# Patient Record
Sex: Male | Born: 1960
Health system: Southern US, Community
[De-identification: ages and names within clinical notes are randomized; demographics above are authoritative.]

## PROBLEM LIST (undated history)

## (undated) ENCOUNTER — Ambulatory Visit: Admission: EM

## (undated) DIAGNOSIS — G629 Polyneuropathy, unspecified: Secondary | ICD-10-CM

## (undated) DIAGNOSIS — G709 Myoneural disorder, unspecified: Secondary | ICD-10-CM

## (undated) DIAGNOSIS — Z9889 Other specified postprocedural states: Secondary | ICD-10-CM

## (undated) DIAGNOSIS — L910 Hypertrophic scar: Secondary | ICD-10-CM

## (undated) DIAGNOSIS — E785 Hyperlipidemia, unspecified: Secondary | ICD-10-CM

## (undated) DIAGNOSIS — N529 Male erectile dysfunction, unspecified: Secondary | ICD-10-CM

## (undated) HISTORY — DX: Polyneuropathy, unspecified: G62.9

## (undated) HISTORY — DX: Hypertrophic scar: L91.0

## (undated) HISTORY — PX: POLYPECTOMY: SHX149

## (undated) HISTORY — DX: Male erectile dysfunction, unspecified: N52.9

## (undated) HISTORY — DX: Myoneural disorder, unspecified: G70.9

## (undated) HISTORY — DX: Other specified postprocedural states: Z98.890

## (undated) HISTORY — PX: EPIDURAL BLOCK INJECTION: SHX1516

## (undated) HISTORY — DX: Hyperlipidemia, unspecified: E78.5

---

## 2006-02-19 ENCOUNTER — Ambulatory Visit: Payer: Self-pay | Admitting: Family Medicine

## 2006-02-26 ENCOUNTER — Ambulatory Visit: Payer: Self-pay | Admitting: Family Medicine

## 2006-04-30 ENCOUNTER — Ambulatory Visit: Payer: Self-pay | Admitting: Family Medicine

## 2006-05-15 ENCOUNTER — Ambulatory Visit: Payer: Self-pay | Admitting: Family Medicine

## 2006-09-18 DIAGNOSIS — N529 Male erectile dysfunction, unspecified: Secondary | ICD-10-CM

## 2006-09-18 HISTORY — PX: LASIK: SHX215

## 2006-09-18 HISTORY — DX: Male erectile dysfunction, unspecified: N52.9

## 2007-03-21 ENCOUNTER — Ambulatory Visit: Payer: Self-pay | Admitting: Family Medicine

## 2008-02-19 ENCOUNTER — Ambulatory Visit: Payer: Self-pay | Admitting: Family Medicine

## 2008-07-16 ENCOUNTER — Ambulatory Visit: Payer: Self-pay | Admitting: Family Medicine

## 2010-04-19 ENCOUNTER — Ambulatory Visit: Payer: Self-pay | Admitting: Physician Assistant

## 2010-04-19 ENCOUNTER — Encounter: Payer: Self-pay | Admitting: Family Medicine

## 2010-04-19 ENCOUNTER — Ambulatory Visit: Payer: Self-pay | Admitting: Vascular Surgery

## 2010-04-19 ENCOUNTER — Ambulatory Visit (HOSPITAL_COMMUNITY): Admission: RE | Admit: 2010-04-19 | Discharge: 2010-04-19 | Payer: Self-pay | Admitting: Family Medicine

## 2010-05-10 ENCOUNTER — Ambulatory Visit: Payer: Self-pay | Admitting: Family Medicine

## 2011-07-24 ENCOUNTER — Telehealth: Payer: Self-pay | Admitting: Family Medicine

## 2011-07-24 NOTE — Telephone Encounter (Signed)
THIS IS THE WRONG PT

## 2011-08-02 ENCOUNTER — Encounter: Payer: Self-pay | Admitting: Family Medicine

## 2011-08-03 ENCOUNTER — Ambulatory Visit (INDEPENDENT_AMBULATORY_CARE_PROVIDER_SITE_OTHER): Payer: 59 | Admitting: Medical

## 2011-08-03 ENCOUNTER — Encounter: Payer: Self-pay | Admitting: Medical

## 2011-08-03 DIAGNOSIS — Z0000 Encounter for general adult medical examination without abnormal findings: Secondary | ICD-10-CM | POA: Insufficient documentation

## 2011-08-03 DIAGNOSIS — N529 Male erectile dysfunction, unspecified: Secondary | ICD-10-CM

## 2011-08-03 DIAGNOSIS — R12 Heartburn: Secondary | ICD-10-CM

## 2011-08-03 DIAGNOSIS — E785 Hyperlipidemia, unspecified: Secondary | ICD-10-CM

## 2011-08-03 LAB — POCT URINALYSIS DIPSTICK
Bilirubin, UA: NEGATIVE
Blood, UA: NEGATIVE
Ketones, UA: NEGATIVE
Leukocytes, UA: NEGATIVE
Spec Grav, UA: <=1.005
pH, UA: 7

## 2011-08-03 MED ORDER — SILDENAFIL CITRATE 100 MG PO TABS: 100.0000 mg | ORAL_TABLET | Freq: Every day | ORAL | Status: DC | PRN

## 2011-08-03 NOTE — Patient Instructions (Signed)
Preventative Care for Adults, Male    Consider daily multivitamin, and consider daily baby Asprin 81 mg for heart health.     REGULAR HEALTH EXAMS:  A routine yearly physical is a good way to check in with your primary care provider about your health and preventive screening. It is also an opportunity to share updates about your health and any concerns you have, and receive a thorough all-over exam.   Most health insurance companies pay for at least some preventative services.  Check with your health plan for specific coverages.  WHAT PREVENTATIVE SERVICES DO MEN NEED?  Adult men should have their weight and blood pressure checked regularly.   Men age 59 and older should have their cholesterol levels checked regularly.  Beginning at age 74 and continuing to age 58, men should be screened for colorectal cancer.  Certain people should may need continued testing until age 74.  Other cancer screening may include exams for testicular and prostate cancer.  Updating vaccinations is part of preventative care.  Vaccinations help protect against diseases such as the flu.  Lab tests are generally done as part of preventative care to screen for anemia and blood disorders, to screen for problems with the kidneys and liver, to screen for bladder problems, to check blood sugar, and to check your cholesterol level.  Preventative services generally include counseling about diet, exercise, avoiding tobacco, drugs, excessive alcohol consumption, and sexually transmitted infections.    GENERAL RECOMMENDATIONS FOR GOOD HEALTH:  Healthy diet:  Eat a variety of foods, including fruit, vegetables, animal or vegetable protein, such as meat, fish, chicken, and eggs, or beans, lentils, tofu, and grains, such as rice.  Drink plenty of water daily.  Decrease saturated fat in the diet, avoid lots of red meat, processed foods, sweets, fast foods, and fried foods.  Exercise:  Aerobic exercise helps maintain  good heart health. At least 30-40 minutes of moderate-intensity exercise is recommended. For example, a brisk walk that increases your heart rate and breathing. This should be done on most days of the week.   Find a type of exercise or a variety of exercises that you enjoy so that it becomes a part of your daily life.  Examples are running, walking, swimming, water aerobics, and biking.  For motivation and support, explore group exercise such as aerobic class, spin class, Zumba, Yoga,or  martial arts, etc.    Set exercise goals for yourself, such as a certain weight goal, walk or run in a race such as a 5k walk/run.  Speak to your primary care provider about exercise goals.  Disease prevention:  If you smoke or chew tobacco, find out from your caregiver how to quit. It can literally save your life, no matter how long you have been a tobacco user. If you do not use tobacco, never begin.   Maintain a healthy diet and normal weight. Increased weight leads to problems with blood pressure and diabetes.   The Body Mass Index or BMI is a way of measuring how much of your body is fat. Having a BMI above 27 increases the risk of heart disease, diabetes, hypertension, stroke and other problems related to obesity. Your caregiver can help determine your BMI and based on it develop an exercise and dietary program to help you achieve or maintain this important measurement at a healthful level.  High blood pressure causes heart and blood vessel problems.  Persistent high blood pressure should be treated with medicine if weight loss and  exercise do not work.   Fat and cholesterol leaves deposits in your arteries that can block them. This causes heart disease and vessel disease elsewhere in your body.  If your cholesterol is found to be high, or if you have heart disease or certain other medical conditions, then you may need to have your cholesterol monitored frequently and be treated with medication.   Ask if you  should have a stress test if your history suggests this. A stress test is a test done on a treadmill that looks for heart disease. This test can find disease prior to there being a problem.  Avoid drinking alcohol in excess (more than two drinks per day).  Avoid use of street drugs. Do not share needles with anyone. Ask for professional help if you need assistance or instructions on stopping the use of alcohol, cigarettes, and/or drugs.  Brush your teeth twice a day with fluoride toothpaste, and floss once a day. Good oral hygiene prevents tooth decay and gum disease. The problems can be painful, unattractive, and can cause other health problems. Visit your dentist for a routine oral and dental check up and preventive care every 6-12 months.   Look at your skin regularly.  Use a mirror to look at your back. Notify your caregivers of changes in moles, especially if there are changes in shapes, colors, a size larger than a pencil eraser, an irregular border, or development of new moles.  Safety:  Use seatbelts 100% of the time, whether driving or as a passenger.  Use safety devices such as hearing protection if you work in environments with loud noise or significant background noise.  Use safety glasses when doing any work that could send debris in to the eyes.  Use a helmet if you ride a bike or motorcycle.  Use appropriate safety gear for contact sports.  Talk to your caregiver about gun safety.  Use sunscreen with a SPF (or skin protection factor) of 15 or greater.  Lighter skinned people are at a greater risk of skin cancer. Don't forget to also wear sunglasses in order to protect your eyes from too much damaging sunlight. Damaging sunlight can accelerate cataract formation.   Practice safe sex. Use condoms. Condoms are used for birth control and to help reduce the spread of sexually transmitted infections (or STIs).  Some of the STIs are gonorrhea (the clap), chlamydia, syphilis, trichomonas,  herpes, HPV (human papilloma virus) and HIV (human immunodeficiency virus) which causes AIDS. The herpes, HIV and HPV are viral illnesses that have no cure. These can result in disability, cancer and death.   Keep carbon monoxide and smoke detectors in your home functioning at all times. Change the batteries every 6 months or use a model that plugs into the wall.   Vaccinations:  Stay up to date with your tetanus shots and other required immunizations. You should have a booster for tetanus every 10 years. Be sure to get your flu shot every year, since 5%-20% of the [place] population comes down with the flu. The flu vaccine changes each year, so being vaccinated once is not enough. Get your shot in the fall, before the flu season peaks.   Other vaccines to consider:  Pneumococcal vaccine to protect against certain types of pneumonia.  This is normally recommended for adults age 80 or older.  However, adults younger than 50 years old with certain underlying conditions such as diabetes, heart or lung disease should also receive the vaccine.  Shingles vaccine  to protect against Varicella Zoster if you are older than age 50, or younger than 50 years old with certain underlying illness.  Hepatitis A vaccine to protect against a form of infection of the liver by a virus acquired from food.  Hepatitis B vaccine to protect against a form of infection of the liver by a virus acquired from blood or body fluids, particularly if you work in health care.  If you plan to travel internationally, check with your local health department for specific vaccination recommendations.  Cancer Screening:  Most routine colon cancer screening begins at the age of 82. On a yearly basis, doctors may provide special easy to use take-home tests to check for hidden blood in the stool. Sigmoidoscopy or colonoscopy can detect the earliest forms of colon cancer and is life saving. These tests use a small camera at the end of a tube  to directly examine the colon. Speak to your caregiver about this at age 46, when routine screening begins (and is repeated every 5 years unless early forms of pre-cancerous polyps or small growths are found).   At the age of 97 men usually start screening for prostate cancer every year. Screening may begin at a younger age for those with higher risk. Those at higher risk include African-Americans or having a family history of prostate cancer. There are two types of tests for prostate cancer:   Prostate-specific antigen (PSA) testing. Recent studies raise questions about prostate cancer using PSA and you should discuss this with your caregiver.   Digital rectal exam (in which your doctor's lubricated and gloved finger feels for enlargement of the prostate through the anus).   Screening for testicular cancer.  Do a monthly exam of your testicles. Gently roll each testicle between your thumb and fingers, feeling for any abnormal lumps. The best time to do this is after a hot shower or bath when the tissues are looser. Notify your caregivers of any lumps, tenderness or changes in size or shape immediately.

## 2011-08-03 NOTE — Progress Notes (Signed)
Subjective:   HPI  Juan Fleming is a 50 y.o. male who presents for a complete physical.  Last visit here August of last year.  He has been in good health, exercises routinely on the treadmill with weights, eats healthy.  Last tetanus shot is within the last 10 years, but he declines flu shot today. He has never had a colonoscopy.  He has a history of rectal dysfunction, does well on Viagra. He has a history of high cholesterol, but hasn't been on medication for a while.  He has a history of keloids, and has seen Dr. Terri Fleming in the past for steroid injections of the keloids.   He is up-to-date on eye doctor and dentist visits.  Reviewed their medical, surgical, family, social, medication, and allergy history and updated chart as appropriate.  Past Medical History  Diagnosis Date  . ED (erectile dysfunction)   . Dyslipidemia   . Wears glasses   . Keloid of skin     Past Surgical History  Procedure Date  . Lasik     Family History  Problem Relation Age of Onset  . Diabetes Mother   . Hypertension Father   . Thyroid disease Sister   . Stroke Neg Hx   . Heart disease Neg Hx   . Hyperlipidemia Neg Hx   . Cancer Neg Hx     History   Social History  . Marital Status: Married    Spouse Name: N/A    Number of Children: N/A  . Years of Education: N/A   Occupational History  . maintenance [employer]   Social History Main Topics  . Smoking status: Never Smoker   . Smokeless tobacco: Not on file  . Alcohol Use: 0.5 oz/week    1 drink(s) per week  . Drug Use: No  . Sexually Active: Not on file   Other Topics Concern  . Not on file   Social History Narrative   Married, 2 children age 60 yo and 80 yo.  Exercise - treadmill, weights    No current outpatient prescriptions on file prior to visit.    No Known Allergies   Review of Systems Constitutional: -fever, -chills, -sweats, -unexpected weight change, -anorexia, -fatigue Allergy: -sneezing, -itching,  -congestion Dermatology: denies changing moles, rash, lumps, new worrisome lesions ENT: -runny nose, +ear itching, -sore throat, -hoarseness, +sinus pain, -teeth pain, -tinnitus, -hearing loss, -epistaxis Cardiology:  -chest pain, -palpitations, -edema, -orthopnea, -paroxysmal nocturnal dyspnea Respiratory: -cough, -shortness of breath, -dyspnea on exertion, -wheezing, -hemoptysis Gastroenterology: -abdominal pain, -nausea, -vomiting, -diarrhea, -constipation, -blood in stool, -changes in bowel movement, -dysphagia Hematology: -bleeding or bruising problems Musculoskeletal: -arthralgias, -myalgias, -joint swelling, -back pain, -neck pain, -cramping, -gait changes Ophthalmology: -vision changes, -eye redness, -itching, -discharge Urology: -dysuria, -difficulty urinating, -hematuria, -urinary frequency, -urgency, incontinence Neurology: -headache, -weakness, -tingling, -numbness, -speech abnormality, -memory loss, -falls, -dizziness Psychology:  -depressed mood, -agitation, -sleep problems    Objective:   Physical Exam  Filed Vitals:   08/03/11 1013  BP: 120/80  Pulse: 80  Temp: 98.1 F (36.7 C)  Resp: 16    General appearance: alert, no distress, WD/WN, black male, slightly overweight Skin:  Multiple keloid scars, linear horizontal keloid on upper chest centrally, keloid on right shoulder, linear vertical keloid along left lower back, brown round flat 3 mm lesion on right forearm midshaft, otherwise skin unremarkable HEENT: normocephalic, conjunctiva/corneas normal, sclerae anicteric, PERRLA, EOMi, nares patent, no discharge or erythema, pharynx normal Oral cavity: MMM, tongue normal, teeth in  good repair Neck: supple, no lymphadenopathy, no thyromegaly, no masses, normal ROM, no bruits Chest: non tender, normal shape and expansion Heart: RRR, normal S1, S2, no murmurs Lungs: CTA bilaterally, no wheezes, rhonchi, or rales Abdomen: +bs, soft, non tender, non distended, no masses, no  hepatomegaly, no splenomegaly, no bruits Back: non tender, normal ROM, no scoliosis Musculoskeletal: upper extremities non tender, no obvious deformity, normal ROM throughout, lower extremities non tender, no obvious deformity, normal ROM throughout Extremities: no edema, no cyanosis, no clubbing Pulses: 2+ symmetric, upper and lower extremities, normal cap refill Neurological: alert, oriented x 3, CN2-12 intact, strength normal upper extremities and lower extremities, sensation normal throughout, DTRs 2+ throughout, no cerebellar signs, gait normal Psychiatric: normal affect, behavior normal, pleasant  GU: normal male external genitalia, nontender, no masses, no hernia, no lymphadenopathy Rectal: Anus normal, prostate within normal limits, occult negative stool   Assessment and Plan :    Encounter Diagnoses  Name Primary?  . General medical examination Yes  . Heartburn   . Erectile dysfunction   . Hyperlipidemia     Physical exam - discussed healthy lifestyle, diet, exercise, preventative care, vaccinations, and addressed their concerns.    Heartburn - advised avoidance of food triggers, consider OTC Zantac or Tums.  Return if symptom worsen.  ED - refilled Viagra, doing well on this.  Hyperlipidemia - discussed his risk factors for heart disease which are male and age close to 76.  I recommend we keep his LDL less than 130.  Labs today.    We will refer him for his first screening colonoscopy.  Follow up pending labs.

## 2011-10-11 ENCOUNTER — Telehealth: Payer: Self-pay | Admitting: Medical

## 2011-10-12 NOTE — Telephone Encounter (Signed)
Juan Fleming WE NEVER GOT IN TOUCH WITH THE PATIENT TO COME BACK IN FOR BLOOD WORK. I LEFT HIM SEVERAL MESSAGES BUT HE NEVER CALLED BACK. WHEN HE CALLED IN YESTERDAY UP FRONT TO GET A REFILL ON HIS MEDICATION. I CALLED HIM RIGHT BACK AND HE STILL DID NOT ANSWER AND THERE WAS NO WAY TO LEAVE A NUMBER. CLS

## 2011-10-12 NOTE — Telephone Encounter (Signed)
pls ask jo to check on labs?  i never got results apparently.  i need an answer on this ASAP

## 2011-10-13 ENCOUNTER — Other Ambulatory Visit: Payer: Self-pay | Admitting: Medical

## 2011-10-13 MED ORDER — SILDENAFIL CITRATE 100 MG PO TABS: 100.0000 mg | ORAL_TABLET | Freq: Every day | ORAL | Status: DC | PRN

## 2011-10-13 NOTE — Telephone Encounter (Signed)
SHANE, FROM WHAT CHANDRA STATED THERE WAS A MIX UP WITH THE LAB WITH HIS BLOOD THAT WAS DRAWN 11/12.  WE HAD A BAD PHONE # & SHE WAS UNABLE TO GET IN TOUCH WITH PT.  THE PHONE # IS CORRECT NOW

## 2011-10-13 NOTE — Telephone Encounter (Signed)
Lmom notifying the patient that he would need to return to our office for lab work. CLS

## 2011-10-13 NOTE — Telephone Encounter (Signed)
Juan Fleming - pls discuss this msg with Vernona Rieger in case she has other info.  I'm still confused.  If I am seeing the msg right, he never came back for fasting labs per the physical.  If this is the case, he needs to come in for fasting labs.    If there is some other issue about the labs or if he has a question, then get him on the phone for me.

## 2011-10-16 ENCOUNTER — Other Ambulatory Visit: Payer: 59

## 2011-10-26 ENCOUNTER — Other Ambulatory Visit: Payer: 59

## 2011-10-26 DIAGNOSIS — N529 Male erectile dysfunction, unspecified: Secondary | ICD-10-CM

## 2011-10-26 DIAGNOSIS — E785 Hyperlipidemia, unspecified: Secondary | ICD-10-CM

## 2011-10-26 DIAGNOSIS — Z0000 Encounter for general adult medical examination without abnormal findings: Secondary | ICD-10-CM

## 2011-10-26 LAB — CBC WITH DIFFERENTIAL/PLATELET
Basophils Relative: 0 % (ref 0–1)
Eosinophils Absolute: 0.1 10*3/uL (ref 0.0–0.7)
Eosinophils Relative: 3 % (ref 0–5)
Hemoglobin: 14.9 g/dL (ref 13.0–17.0)
Lymphs Abs: 1.7 10*3/uL (ref 0.7–4.0)
MCH: 27.2 pg (ref 26.0–34.0)
MCHC: 31.8 g/dL (ref 30.0–36.0)
MCV: 85.6 fL (ref 78.0–100.0)
Monocytes Absolute: 0.6 10*3/uL (ref 0.1–1.0)
Monocytes Relative: 13 % — ABNORMAL HIGH (ref 3–12)
Neutrophils Relative %: 45 % (ref 43–77)
RBC: 5.47 MIL/uL (ref 4.22–5.81)

## 2011-10-27 LAB — LIPID PANEL
Cholesterol: 217 mg/dL — ABNORMAL HIGH (ref 0–200)
HDL: 50 mg/dL (ref >39)
LDL Cholesterol: 146 mg/dL — ABNORMAL HIGH (ref 0–99)
Total CHOL/HDL Ratio: 4.3 Ratio
Triglycerides: 105 mg/dL (ref <150)
VLDL: 21 mg/dL (ref 0–40)

## 2011-10-27 LAB — COMPREHENSIVE METABOLIC PANEL
Alkaline Phosphatase: 63 U/L (ref 39–117)
BUN: 15 mg/dL (ref 6–23)
CO2: 25 mEq/L (ref 19–32)
Creat: 1.37 mg/dL — ABNORMAL HIGH (ref 0.50–1.35)
Glucose, Bld: 90 mg/dL (ref 70–99)
Sodium: 142 mEq/L (ref 135–145)
Total Bilirubin: 0.6 mg/dL (ref 0.3–1.2)
Total Protein: 6.9 g/dL (ref 6.0–8.3)

## 2012-04-10 ENCOUNTER — Emergency Department (HOSPITAL_COMMUNITY)
Admission: EM | Admit: 2012-04-10 | Discharge: 2012-04-10 | Disposition: A | Discharge status: Home / Self Care | Payer: 59 | Source: Home / Self Care

## 2012-04-10 ENCOUNTER — Encounter (HOSPITAL_COMMUNITY): Payer: Self-pay

## 2012-04-10 DIAGNOSIS — M79609 Pain in unspecified limb: Secondary | ICD-10-CM

## 2012-04-10 DIAGNOSIS — M79603 Pain in arm, unspecified: Secondary | ICD-10-CM

## 2012-04-10 DIAGNOSIS — T148XXA Other injury of unspecified body region, initial encounter: Secondary | ICD-10-CM

## 2012-04-10 MED ORDER — IBUPROFEN 800 MG PO TABS
800.0000 mg | ORAL_TABLET | Freq: Once | ORAL | Status: AC
  Administered 2012-04-10: 800 mg via ORAL

## 2012-04-10 MED ORDER — CYCLOBENZAPRINE HCL 10 MG PO TABS: 10.0000 mg | ORAL_TABLET | Freq: Two times a day (BID) | ORAL | Status: AC | PRN

## 2012-04-10 MED ORDER — IBUPROFEN 800 MG PO TABS
ORAL_TABLET | ORAL | Status: AC
  Filled 2012-04-10: qty 1

## 2012-04-10 MED ORDER — NAPROXEN 500 MG PO TABS: 500.0000 mg | ORAL_TABLET | Freq: Two times a day (BID) | ORAL | Status: AC

## 2012-04-10 MED ORDER — OMEPRAZOLE 20 MG PO CPDR: 20.0000 mg | DELAYED_RELEASE_CAPSULE | Freq: Every day | ORAL | Status: DC

## 2012-04-10 NOTE — ED Notes (Signed)
C/o pain to rt upper arm for 2 weeks.  Denies injury or fall.  States he fractured this arm as a child.

## 2012-04-10 NOTE — ED Provider Notes (Signed)
History     CSN: [id number]  Arrival date & time 04/10/12  1114   None     Chief Complaint  Patient presents with  . Arm Pain    (Consider location/radiation/quality/duration/timing/severity/associated sxs/prior treatment) Patient is a 51 y.o. male presenting with arm pain. The history is provided by the patient.  Arm Pain This is a new problem. The current episode started more than 1 week ago. The problem occurs daily. The problem has not changed since onset.Pertinent negatives include no chest pain, no abdominal pain, no headaches and no shortness of breath. The symptoms are aggravated by twisting (palpation). Nothing relieves the symptoms. Treatments tried: ibuprofen x1. The treatment provided mild relief.  history of humerus fracture many years ago, concerned this may be a break, no known injury, admits to working out (aerobics) over the past six months.  Past Medical History  Diagnosis Date  . ED (erectile dysfunction)   . Dyslipidemia   . Wears glasses   . Keloid of skin     Past Surgical History  Procedure Date  . Lasik     Family History  Problem Relation Age of Onset  . Diabetes Mother   . Hypertension Father   . Thyroid disease Sister   . Stroke Neg Hx   . Heart disease Neg Hx   . Hyperlipidemia Neg Hx   . Cancer Neg Hx     History  Substance Use Topics  . Smoking status: Never Smoker   . Smokeless tobacco: Not on file  . Alcohol Use: No      Review of Systems  Respiratory: Negative for shortness of breath.   Cardiovascular: Negative for chest pain.  Gastrointestinal: Negative for abdominal pain.  Neurological: Negative for headaches.  All other systems reviewed and are negative.    Allergies  Review of patient's allergies indicates no known allergies.  Home Medications   Current Outpatient Rx  Name Route Sig Dispense Refill  . CYCLOBENZAPRINE HCL 10 MG PO TABS Oral Take 1 tablet (10 mg total) by mouth 2 (two) times daily as needed for  muscle spasms. 20 tablet 0  . NAPROXEN 500 MG PO TABS Oral Take 1 tablet (500 mg total) by mouth 2 (two) times daily. 30 tablet 0  . SILDENAFIL CITRATE 100 MG PO TABS Oral Take 1 tablet (100 mg total) by mouth daily as needed. 1/2 tablet daily prn 10 tablet 3    BP 120/88  Pulse 81  Temp 98.3 F (36.8 C) (Oral)  Resp 16  SpO2 100%  Physical Exam  Nursing note and vitals reviewed. Constitutional: He is oriented to person, place, and time. Vital signs are normal. He appears well-developed and well-nourished. He is active and cooperative.  HENT:  Head: Normocephalic.  Eyes: Conjunctivae are normal. Pupils are equal, round, and reactive to light. No scleral icterus.  Neck: Trachea normal, normal range of motion and full passive range of motion without pain. Neck supple. Muscular tenderness present. No spinous process tenderness present. Normal range of motion present.       Right paraspinal tenderness below C7 into right trapezius muscle  Cardiovascular: Normal rate and regular rhythm.   Pulmonary/Chest: Effort normal and breath sounds normal. He exhibits no tenderness.  Musculoskeletal:       Right shoulder: Normal.       Right elbow: Normal.      Right wrist: Normal.       Cervical back: He exhibits tenderness.       Thoracic  back: Normal.       Right upper arm: He exhibits tenderness. He exhibits no bony tenderness and no edema.       Right forearm: Normal.       Right hand: He exhibits normal range of motion, no tenderness, normal two-point discrimination and normal capillary refill. normal sensation noted. Normal strength noted.       Mid arm tenderness with palpation.  Neurological: He is alert and oriented to person, place, and time. He has normal strength. No cranial nerve deficit or sensory deficit. GCS eye subscore is 4. GCS verbal subscore is 5. GCS motor subscore is 6.  Skin: Skin is warm and dry.  Psychiatric: He has a normal mood and affect. His speech is normal and  behavior is normal. Judgment and thought content normal. Cognition and memory are normal.    ED Course  Procedures (including critical care time)  Labs Reviewed - No data to display No results found.   1. Muscle strain   2. Arm pain       MDM  Do not believe pain is related to fracture, no injury to suggest that, this is a musculoskeletal injury, take medications as prescribed for pain, apply intermittent application of cold packs (later, may switch to heat, but do not sleep on heating pad).  Call or return to clinic prn if these symptoms worsen or fail to improve as anticipated. Imaging not indicated at this time.        Johnsie Kindred, NP 04/10/12 1218  Johnsie Kindred, NP 04/10/12 1218

## 2012-04-11 NOTE — ED Provider Notes (Signed)
Medical screening examination/treatment/procedure(s) were performed by non-physician practitioner and as supervising physician I was immediately available for consultation/collaboration.   [facility]; MD   Sharin Grave, MD 04/11/12 [phone]

## 2012-07-23 ENCOUNTER — Encounter: Payer: Self-pay | Admitting: Family Medicine

## 2012-07-23 ENCOUNTER — Ambulatory Visit (INDEPENDENT_AMBULATORY_CARE_PROVIDER_SITE_OTHER): Payer: 59 | Admitting: Family Medicine

## 2012-07-23 VITALS — BP 120/80 | HR 90 | Wt 199.0 lb

## 2012-07-23 DIAGNOSIS — M542 Cervicalgia: Secondary | ICD-10-CM

## 2012-07-23 NOTE — Progress Notes (Signed)
  Subjective:    Patient ID: Juan Fleming, male    DOB: Aug 31, 1961, 51 y.o.   MRN: [id number]  HPI He complains of a two-month history of right-sided neck pain with deviation into the shoulder when he flexes his neck to the right. He's had no numbness, tingling or weakness. The pain has not changed in the last several months. No history of injury to that area.   Review of Systems     Objective:   Physical Exam Alert and in no distress. Good range of motion of his neck with pain with right lateral flexion and radiation to the shoulder. Normal motor, sensory DTRs. No tenderness palpation of the neck.       Assessment & Plan:   1. Neck pain on right side  MR Angiogram Neck W Wo Contrast, MR Cervical Spine Wo Contrast   MR angiogram was removed

## 2012-07-26 ENCOUNTER — Ambulatory Visit
Admission: RE | Admit: 2012-07-26 | Discharge: 2012-07-26 | Disposition: A | Discharge status: Home / Self Care | Payer: 59 | Source: Ambulatory Visit | Attending: Family Medicine | Admitting: Family Medicine

## 2012-07-26 ENCOUNTER — Other Ambulatory Visit: Payer: 59

## 2012-07-26 DIAGNOSIS — M542 Cervicalgia: Secondary | ICD-10-CM

## 2012-08-12 ENCOUNTER — Telehealth: Payer: Self-pay

## 2012-08-12 NOTE — Telephone Encounter (Signed)
Dr.Lalonde Chris from [employer] called left me a message that they have tried calling and have left 3 messages have not had a return call just FYI for you

## 2013-10-01 ENCOUNTER — Ambulatory Visit (INDEPENDENT_AMBULATORY_CARE_PROVIDER_SITE_OTHER): Payer: 59 | Admitting: Medical

## 2013-10-01 ENCOUNTER — Encounter: Payer: Self-pay | Admitting: Medical

## 2013-10-01 ENCOUNTER — Encounter: Payer: Self-pay | Admitting: Internal Medicine

## 2013-10-01 VITALS — BP 110/80 | HR 82 | Temp 97.7°F | Resp 16 | Ht 69.5 in | Wt 196.0 lb

## 2013-10-01 DIAGNOSIS — Z1211 Encounter for screening for malignant neoplasm of colon: Secondary | ICD-10-CM

## 2013-10-01 DIAGNOSIS — N529 Male erectile dysfunction, unspecified: Secondary | ICD-10-CM

## 2013-10-01 DIAGNOSIS — Z0000 Encounter for general adult medical examination without abnormal findings: Secondary | ICD-10-CM

## 2013-10-01 DIAGNOSIS — Z125 Encounter for screening for malignant neoplasm of prostate: Secondary | ICD-10-CM

## 2013-10-01 DIAGNOSIS — E785 Hyperlipidemia, unspecified: Secondary | ICD-10-CM

## 2013-10-01 DIAGNOSIS — Z23 Encounter for immunization: Secondary | ICD-10-CM

## 2013-10-01 DIAGNOSIS — J309 Allergic rhinitis, unspecified: Secondary | ICD-10-CM

## 2013-10-01 DIAGNOSIS — L84 Corns and callosities: Secondary | ICD-10-CM

## 2013-10-01 LAB — CBC
HEMATOCRIT: 47.7 % (ref 39.0–52.0)
Hemoglobin: 16.1 g/dL (ref 13.0–17.0)
MCH: 27.6 pg (ref 26.0–34.0)
MCHC: 33.8 g/dL (ref 30.0–36.0)
MCV: 81.7 fL (ref 78.0–100.0)
Platelets: 311 10*3/uL (ref 150–400)
RBC: 5.84 MIL/uL — AB (ref 4.22–5.81)
RDW: 13.9 % (ref 11.5–15.5)
WBC: 5.4 10*3/uL (ref 4.0–10.5)

## 2013-10-01 LAB — COMPREHENSIVE METABOLIC PANEL
ALT: 19 U/L (ref 0–53)
AST: 19 U/L (ref 0–37)
Albumin: 4.3 g/dL (ref 3.5–5.2)
Alkaline Phosphatase: 84 U/L (ref 39–117)
BILIRUBIN TOTAL: 0.6 mg/dL (ref 0.3–1.2)
BUN: 16 mg/dL (ref 6–23)
CALCIUM: 9.8 mg/dL (ref 8.4–10.5)
CHLORIDE: 99 meq/L (ref 96–112)
CO2: 29 meq/L (ref 19–32)
CREATININE: 1.28 mg/dL (ref 0.50–1.35)
GLUCOSE: 81 mg/dL (ref 70–99)
Potassium: 4.1 mEq/L (ref 3.5–5.3)
Sodium: 136 mEq/L (ref 135–145)
Total Protein: 7.4 g/dL (ref 6.0–8.3)

## 2013-10-01 LAB — POCT URINALYSIS DIPSTICK
BILIRUBIN UA: NEGATIVE
Blood, UA: NEGATIVE
GLUCOSE UA: NEGATIVE
KETONES UA: NEGATIVE
LEUKOCYTES UA: NEGATIVE
Nitrite, UA: NEGATIVE
PH UA: 5
Protein, UA: NEGATIVE
Spec Grav, UA: <=1.005
Urobilinogen, UA: NEGATIVE

## 2013-10-01 LAB — LIPID PANEL
CHOL/HDL RATIO: 4.3 ratio
CHOLESTEROL: 226 mg/dL — AB (ref 0–200)
HDL: 52 mg/dL (ref >39)
LDL Cholesterol: 153 mg/dL — ABNORMAL HIGH (ref 0–99)
TRIGLYCERIDES: 107 mg/dL (ref <150)
VLDL: 21 mg/dL (ref 0–40)

## 2013-10-01 MED ORDER — SILDENAFIL CITRATE 100 MG PO TABS: 100.0000 mg | ORAL_TABLET | Freq: Every day | ORAL | Status: DC | PRN

## 2013-10-01 NOTE — Addendum Note (Signed)
Addended by: Carlena Hurl on: 10/01/2013 11:47 AM   Modules accepted: Orders

## 2013-10-01 NOTE — Patient Instructions (Signed)
Encounter Diagnoses  Name Primary?  . Routine general medical examination at a health care facility Yes  . Special screening for malignant neoplasms, colon   . Screening for prostate cancer   . Hyperlipidemia   . Erectile dysfunction   . Need for Tdap vaccination   . Allergic rhinitis   . Corn of foot    We have referred you for your first screening colonoscopy  Pending labs, I may have you start cholesterol medication. Limit red meat and high cholesterol foods.  We updated your tetanus diphtheria and pertussis vaccine today, this is good for 10 years.  Begin over-the-counter of Allegra or Zyrtec at bedtime for postnasal drainage and phlegm and allergen triggers.  Use this daily at bedtime.  Corn of foot-try doing Epsom salt soaks and Pumice stone to the foot area several days per week.  You can also try over-the-counter corn remover.  If this doesn't help we can refer you to dermatology  Preventative Care for Adults, Male       REGULAR HEALTH EXAMS:  A routine yearly physical is a good way to check in with your primary care provider about your health and preventive screening. It is also an opportunity to share updates about your health and any concerns you have, and receive a thorough all-over exam.   Most health insurance companies pay for at least some preventative services.  Check with your health plan for specific coverages.  WHAT PREVENTATIVE SERVICES DO MEN NEED?  Adult men should have their weight and blood pressure checked regularly.   Men age 26 and older should have their cholesterol levels checked regularly.  Beginning at age 100 and continuing to age 43, men should be screened for colorectal cancer.  Certain people should may need continued testing until age 73.  Other cancer screening may include exams for testicular and prostate cancer.  Updating vaccinations is part of preventative care.  Vaccinations help protect against diseases such as the flu.  Lab tests  are generally done as part of preventative care to screen for anemia and blood disorders, to screen for problems with the kidneys and liver, to screen for bladder problems, to check blood sugar, and to check your cholesterol level.  Preventative services generally include counseling about diet, exercise, avoiding tobacco, drugs, excessive alcohol consumption, and sexually transmitted infections.    GENERAL RECOMMENDATIONS FOR GOOD HEALTH:  Healthy diet:  Eat a variety of foods, including fruit, vegetables, animal or vegetable protein, such as meat, fish, chicken, and eggs, or beans, lentils, tofu, and grains, such as rice.  Drink plenty of water daily.  Decrease saturated fat in the diet, avoid lots of red meat, processed foods, sweets, fast foods, and fried foods.  Exercise:  Aerobic exercise helps maintain good heart health. At least 30-40 minutes of moderate-intensity exercise is recommended. For example, a brisk walk that increases your heart rate and breathing. This should be done on most days of the week.   Find a type of exercise or a variety of exercises that you enjoy so that it becomes a part of your daily life.  Examples are running, walking, swimming, water aerobics, and biking.  For motivation and support, explore group exercise such as aerobic class, spin class, Zumba, Yoga,or  martial arts, etc.    Set exercise goals for yourself, such as a certain weight goal, walk or run in a race such as a 5k walk/run.  Speak to your primary care provider about exercise goals.  Disease prevention:  If you smoke or chew tobacco, find out from your caregiver how to quit. It can literally save your life, no matter how long you have been a tobacco user. If you do not use tobacco, never begin.   Maintain a healthy diet and normal weight. Increased weight leads to problems with blood pressure and diabetes.   The Body Mass Index or BMI is a way of measuring how much of your body is fat. Having  a BMI above 27 increases the risk of heart disease, diabetes, hypertension, stroke and other problems related to obesity. Your caregiver can help determine your BMI and based on it develop an exercise and dietary program to help you achieve or maintain this important measurement at a healthful level.  High blood pressure causes heart and blood vessel problems.  Persistent high blood pressure should be treated with medicine if weight loss and exercise do not work.   Fat and cholesterol leaves deposits in your arteries that can block them. This causes heart disease and vessel disease elsewhere in your body.  If your cholesterol is found to be high, or if you have heart disease or certain other medical conditions, then you may need to have your cholesterol monitored frequently and be treated with medication.   Ask if you should have a stress test if your history suggests this. A stress test is a test done on a treadmill that looks for heart disease. This test can find disease prior to there being a problem.  Avoid drinking alcohol in excess (more than two drinks per day).  Avoid use of street drugs. Do not share needles with anyone. Ask for professional help if you need assistance or instructions on stopping the use of alcohol, cigarettes, and/or drugs.  Brush your teeth twice a day with fluoride toothpaste, and floss once a day. Good oral hygiene prevents tooth decay and gum disease. The problems can be painful, unattractive, and can cause other health problems. Visit your dentist for a routine oral and dental check up and preventive care every 6-12 months.   Look at your skin regularly.  Use a mirror to look at your back. Notify your caregivers of changes in moles, especially if there are changes in shapes, colors, a size larger than a pencil eraser, an irregular border, or development of new moles.  Safety:  Use seatbelts 100% of the time, whether driving or as a passenger.  Use safety devices such  as hearing protection if you work in environments with loud noise or significant background noise.  Use safety glasses when doing any work that could send debris in to the eyes.  Use a helmet if you ride a bike or motorcycle.  Use appropriate safety gear for contact sports.  Talk to your caregiver about gun safety.  Use sunscreen with a SPF (or skin protection factor) of 15 or greater.  Lighter skinned people are at a greater risk of skin cancer. Don't forget to also wear sunglasses in order to protect your eyes from too much damaging sunlight. Damaging sunlight can accelerate cataract formation.   Practice safe sex. Use condoms. Condoms are used for birth control and to help reduce the spread of sexually transmitted infections (or STIs).  Some of the STIs are gonorrhea (the clap), chlamydia, syphilis, trichomonas, herpes, HPV (human papilloma virus) and HIV (human immunodeficiency virus) which causes AIDS. The herpes, HIV and HPV are viral illnesses that have no cure. These can result in disability, cancer and death.   Keep  carbon monoxide and smoke detectors in your home functioning at all times. Change the batteries every 6 months or use a model that plugs into the wall.   Vaccinations:  Stay up to date with your tetanus shots and other required immunizations. You should have a booster for tetanus every 10 years. Be sure to get your flu shot every year, since 5%-20% of the [place] population comes down with the flu. The flu vaccine changes each year, so being vaccinated once is not enough. Get your shot in the fall, before the flu season peaks.   Other vaccines to consider:  Pneumococcal vaccine to protect against certain types of pneumonia.  This is normally recommended for adults age 37 or older.  However, adults younger than 53 years old with certain underlying conditions such as diabetes, heart or lung disease should also receive the vaccine.  Shingles vaccine to protect against Varicella Zoster  if you are older than age 25, or younger than 53 years old with certain underlying illness.  Hepatitis A vaccine to protect against a form of infection of the liver by a virus acquired from food.  Hepatitis B vaccine to protect against a form of infection of the liver by a virus acquired from blood or body fluids, particularly if you work in health care.  If you plan to travel internationally, check with your local health department for specific vaccination recommendations.  Cancer Screening:  Most routine colon cancer screening begins at the age of 45. On a yearly basis, doctors may provide special easy to use take-home tests to check for hidden blood in the stool. Sigmoidoscopy or colonoscopy can detect the earliest forms of colon cancer and is life saving. These tests use a small camera at the end of a tube to directly examine the colon. Speak to your caregiver about this at age 65, when routine screening begins (and is repeated every 5 years unless early forms of pre-cancerous polyps or small growths are found).   At the age of 68 men usually start screening for prostate cancer every year. Screening may begin at a younger age for those with higher risk. Those at higher risk include African-Americans or having a family history of prostate cancer. There are two types of tests for prostate cancer:   Prostate-specific antigen (PSA) testing. Recent studies raise questions about prostate cancer using PSA and you should discuss this with your caregiver.   Digital rectal exam (in which your doctor's lubricated and gloved finger feels for enlargement of the prostate through the anus).   Screening for testicular cancer.  Do a monthly exam of your testicles. Gently roll each testicle between your thumb and fingers, feeling for any abnormal lumps. The best time to do this is after a hot shower or bath when the tissues are looser. Notify your caregivers of any lumps, tenderness or changes in size or shape  immediately.

## 2013-10-01 NOTE — Progress Notes (Signed)
Subjective:   HPI  Juan Fleming is a 53 y.o. male who presents for a complete physical.     Preventative care: Last ophthalmology visit:N/A Last dental visit:YES DR. HOBBS Last colonoscopy:N/A Last prostate exam: 2013 Last EKG:04/2010 Last labs:2013  Prior vaccinations: TD or Tdap:? Influenza:NO Pneumococcal:N/A Shingles/Zostavax:N/A Other: NO OTHER DOCTORS   Concerns: Hyperlipidemia - not currenlty on medication.  eatrs chicken, some lunch meat, not a lot of red meat though.    ED - does ok on viagra.     He notes some sinus drainage, post nasal drainage.  Does get some runny nose and sneezing, itching eyes.    Sees to get a lot of phlegm.    No fever, not sick feeling.     Complains of a sore tender place of his right foot, x2 weeks.  Reviewed their medical, surgical, family, social, medication, and allergy history and updated chart as appropriate.  Past Medical History  Diagnosis Date  . ED (erectile dysfunction) 2008  . Dyslipidemia   . Keloid of skin   . History of EKG 1/15  . Status post LASIK surgery     Past Surgical History  Procedure Laterality Date  . Lasik    . Colonoscopy      pending 09/2013    History   Social History  . Marital Status: Married    Spouse Name: N/A    Number of Children: N/A  . Years of Education: N/A   Occupational History  . maintenance [employer]   Social History Main Topics  . Smoking status: Never Smoker   . Smokeless tobacco: Not on file  . Alcohol Use: No     Comment: occasional on a holiday  . Drug Use: No  . Sexual Activity: Not on file   Other Topics Concern  . Not on file   Social History Narrative   Married, 2 children, Exercise -frequently, treadmill, weights.  Works at [employer], [employer]    Family History  Problem Relation Age of Onset  . Diabetes Mother   . Hypertension Father   . Thyroid disease Sister   . Stroke Neg Hx   . Heart disease Neg Hx   . Hyperlipidemia Neg Hx   . Cancer  Neg Hx     Current outpatient prescriptions:sildenafil (VIAGRA) 100 MG tablet, Take 1 tablet (100 mg total) by mouth daily as needed. 1/2 tablet daily prn, Disp: 10 tablet, Rfl: 3;  omeprazole (PRILOSEC) 20 MG capsule, Take 1 capsule (20 mg total) by mouth daily., Disp: 30 capsule, Rfl: 2  No Known Allergies     Review of Systems Constitutional: -fever, -chills, -sweats, -unexpected weight change, -decreased appetite, -fatigue Allergy: -sneezing, -itching, -congestion Dermatology: -changing moles, --rash, -lumps ENT: -runny nose, -ear pain, -sore throat, -hoarseness, -sinus pain, -teeth pain, - ringing in ears, -hearing loss, -nosebleeds Cardiology: -chest pain, -palpitations, -swelling, -difficulty breathing when lying flat, -waking up short of breath Respiratory: -cough, -shortness of breath, -difficulty breathing with exercise or exertion, -wheezing, -coughing up blood Gastroenterology: -abdominal pain, -nausea, -vomiting, -diarrhea, -constipation, -blood in stool, -changes in bowel movement, -difficulty swallowing or eating Hematology: -bleeding, -bruising  Musculoskeletal: -joint aches, -muscle aches, -joint swelling, -back pain, -neck pain, -cramping, -changes in gait, Ophthalmology: denies vision changes, eye redness, itching, discharge Urology: -burning with urination, -difficulty urinating, -blood in urine, -urinary frequency, -urgency, -incontinence Neurology: -headache, -weakness, -tingling, -numbness, -memory loss, -falls, -dizziness Psychology: -depressed mood, -agitation, -sleep problems     Objective:   Physical Exam  BP 110/80  Pulse 82  Temp(Src) 97.7 F (36.5 C) (Oral)  Resp 16  Ht 5' 9.5" (1.765 m)  Wt 196 lb (88.905 kg)  BMI 28.54 kg/m2  General appearance: alert, no distress, WD/WN, AA male Skin: scattered benign appearing lesions, no worrisome lesions HEENT: normocephalic, conjunctiva/corneas normal, sclerae anicteric, PERRLA, EOMi, nares patent, no  discharge or erythema, pharynx normal Oral cavity: MMM, tongue normal, teeth in good repair Neck: supple, no lymphadenopathy, no thyromegaly, no masses, normal ROM, no bruits Chest: non tender, normal shape and expansion Heart: RRR, normal S1, S2, no murmurs Lungs: CTA bilaterally, no wheezes, rhonchi, or rales Abdomen: +bs, soft, non tender, non distended, no masses, no hepatomegaly, no splenomegaly, no bruits Back: non tender, normal ROM, no scoliosis Musculoskeletal: right volar foot overlying 4th distal metatarsal bone with round 2mm tender density likley corn, bony tibial tuberosities suggestive of prior osgood slatters, upper extremities non tender, no obvious deformity, normal ROM throughout, lower extremities non tender, no obvious deformity, normal ROM throughout Extremities: no edema, no cyanosis, no clubbing Pulses: 2+ symmetric, upper and lower extremities, normal cap refill Neurological: alert, oriented x 3, CN2-12 intact, strength normal upper extremities and lower extremities, sensation normal throughout, DTRs 2+ throughout, no cerebellar signs, gait normal Psychiatric: normal affect, behavior normal, pleasant  GU: normal male external genitalia, circumcised, nontender, no masses, no hernia, no lymphadenopathy Rectal: anus normal tone, prostate WNL, occult negative stool   Adult ECG Report  Indication: erectile dysfunction  Rate: 77 bpm  Rhythm: normal sinus rhythm  QRS Axis: 47 degrees  PR Interval: 142ms  QRS Duration: 9ms  QTc: 331ms  Conduction Disturbances: none  Other Abnormalities: Isolated ST depression in lead 3 unchanged from prior EKG  Patient's cardiac risk factors are: dyslipidemia, ED  EKG comparison: 2006, unchanged, no new changes  Narrative Interpretation: No acute changes   Assessment and Plan :      Encounter Diagnoses  Name Primary?  . Routine general medical examination at a health care facility Yes  . Special screening for malignant  neoplasms, colon   . Screening for prostate cancer   . Hyperlipidemia   . Erectile dysfunction   . Need for Tdap vaccination     Physical exam - discussed healthy lifestyle, diet, exercise, preventative care, vaccinations, and addressed their concerns.  I advised he see eye doctor and dentist yearly. Referral for first colonoscopy screening Hyperlipidemia-pending labs, we'll likely start him on statin.  He has had plenty of prior attempts at diet and prior labs all show abnormal lipids.  Has been on lipitor and zocor in the past but with poor compliance Erectile dysfunction-c/t Viagra prn. Counseled on the Tdap (tetanus, diptheria, and acellular pertussis) vaccine.  Vaccine information sheet given. Tdap vaccine given after consent obtained. Follow-up pending labs

## 2013-10-02 ENCOUNTER — Other Ambulatory Visit: Payer: Self-pay | Admitting: Medical

## 2013-10-02 LAB — PSA: PSA: 1.36 ng/mL (ref <=4.00)

## 2013-10-02 MED ORDER — ATORVASTATIN CALCIUM 20 MG PO TABS: 20.0000 mg | ORAL_TABLET | Freq: Every day | ORAL | Status: DC

## 2013-10-03 ENCOUNTER — Encounter: Payer: Self-pay | Admitting: Family Medicine

## 2013-11-07 ENCOUNTER — Ambulatory Visit (AMBULATORY_SURGERY_CENTER): Payer: Self-pay | Admitting: *Deleted

## 2013-11-07 VITALS — Ht 69.0 in | Wt 198.8 lb

## 2013-11-07 DIAGNOSIS — Z1211 Encounter for screening for malignant neoplasm of colon: Secondary | ICD-10-CM

## 2013-11-07 MED ORDER — MOVIPREP 100 G PO SOLR: ORAL | Status: DC

## 2013-11-07 NOTE — Progress Notes (Signed)
No allergies to eggs or soy. No prior anesthesia.  

## 2013-11-13 ENCOUNTER — Encounter: Payer: Self-pay | Admitting: Internal Medicine

## 2013-11-16 HISTORY — PX: COLONOSCOPY: SHX174

## 2013-11-18 ENCOUNTER — Telehealth: Payer: Self-pay | Admitting: Internal Medicine

## 2013-11-18 NOTE — Telephone Encounter (Signed)
Called pt at home. He states he had eaten butter beans and wheat crackers on Saturday and did not know if he needs to cancel the procedure for Friday.Informed pt to try to force plenty of liquids and to avoid the foods with fiber listed on his instruction sheet. He does not need to cancel the colonoscopy on Friday.Advised  pt to call our office if he has any further questions.He understood.

## 2013-11-21 ENCOUNTER — Ambulatory Visit (AMBULATORY_SURGERY_CENTER): Payer: 59 | Admitting: Internal Medicine

## 2013-11-21 ENCOUNTER — Encounter: Payer: Self-pay | Admitting: Internal Medicine

## 2013-11-21 VITALS — BP 92/71 | HR 71 | Temp 97.2°F | Resp 14 | Ht 69.0 in | Wt 198.0 lb

## 2013-11-21 DIAGNOSIS — D126 Benign neoplasm of colon, unspecified: Secondary | ICD-10-CM

## 2013-11-21 DIAGNOSIS — Z1211 Encounter for screening for malignant neoplasm of colon: Secondary | ICD-10-CM

## 2013-11-21 MED ORDER — SODIUM CHLORIDE 0.9 % IV SOLN: 500.0000 mL | INTRAVENOUS | Status: DC

## 2013-11-21 NOTE — Progress Notes (Signed)
Stable to RR 

## 2013-11-21 NOTE — Progress Notes (Signed)
Called to room to assist during endoscopic procedure.  Patient ID and intended procedure confirmed with present staff. Received instructions for my participation in the procedure from the performing physician.  

## 2013-11-21 NOTE — Op Note (Signed)
[place]  [address] [place], [id number]   COLONOSCOPY PROCEDURE REPORT  PATIENT: Juan Fleming, Juan Fleming  MR#: [id number] BIRTHDATE: 09-Nov-1960 , 52  yrs. old GENDER: Male ENDOSCOPIST: Jerene Bears, MD REFERRED WU:[facility] [employer], M.D. PROCEDURE DATE:  11/21/2013 PROCEDURE:   Colonoscopy with snare polypectomy First Screening Colonoscopy - Avg.  risk and is 50 yrs.  old or older Yes.  Prior Negative Screening - Now for repeat screening. N/A  History of Adenoma - Now for follow-up colonoscopy & has been > or = to 3 yrs.  N/A  Polyps Removed Today? Yes. ASA CLASS:   Class II INDICATIONS:average risk screening and first colonoscopy. MEDICATIONS: MAC sedation, administered by CRNA and propofol (Diprivan) 200mg  IV  DESCRIPTION OF PROCEDURE:   After the risks benefits and alternatives of the procedure were thoroughly explained, informed consent was obtained.  A digital rectal exam revealed no rectal mass.   The LB [id number]  endoscope was introduced through the anus and advanced to the cecum, which was identified by both the appendix and ileocecal valve. No adverse events experienced. The quality of the prep was good, using MoviPrep  The instrument was then slowly withdrawn as the colon was fully examined.   COLON FINDINGS: A sessile polyp measuring 6 mm in size was found in the ascending colon.  A polypectomy was performed with a cold snare.  The resection was complete and the polyp tissue was completely retrieved.   Mild diverticulosis was noted in the sigmoid colon.  Retroflexed views revealed small internal hemorrhoids. The time to cecum=2 minutes 07 seconds.  Withdrawal time=11 minutes 13 seconds.  The scope was withdrawn and the procedure completed. COMPLICATIONS: There were no complications.  ENDOSCOPIC IMPRESSION: 1.   Sessile polyp measuring 6 mm in size was found in the ascending colon; polypectomy was performed with a cold snare 2.   Mild  diverticulosis was noted in the sigmoid colon  RECOMMENDATIONS: 1.  Await pathology results 2.  High fiber diet 3.  If the polyp removed today is proven to be an adenomatous (pre-cancerous) polyp, you will need a repeat colonoscopy in 5 years.  Otherwise you should continue to follow colorectal cancer screening guidelines for "routine risk" patients with colonoscopy in 10 years.  You will receive a letter within 1-2 weeks with the results of your biopsy as well as final recommendations.  Please call my office if you have not received a letter after 3 weeks.   eSigned:  Jerene Bears, MD 11/21/2013 8:51 AM  cc: The Patient and Jill Alexanders, MD

## 2013-11-21 NOTE — Patient Instructions (Signed)
YOU HAD AN ENDOSCOPIC PROCEDURE TODAY AT THE [facility]: Refer to the procedure report that was given to you for any specific questions about what was found during the examination.  If the procedure report does not answer your questions, please call your gastroenterologist to clarify.  If you requested that your care partner not be given the details of your procedure findings, then the procedure report has been included in a sealed envelope for you to review at your convenience later.  YOU SHOULD EXPECT: Some feelings of bloating in the abdomen. Passage of more gas than usual.  Walking can help get rid of the air that was put into your GI tract during the procedure and reduce the bloating. If you had a lower endoscopy (such as a colonoscopy or flexible sigmoidoscopy) you may notice spotting of blood in your stool or on the toilet paper. If you underwent a bowel prep for your procedure, then you may not have a normal bowel movement for a few days.  DIET: Your first meal following the procedure should be a light meal and then it is ok to progress to your normal diet.  A half-sandwich or bowl of soup is an example of a good first meal.  Heavy or fried foods are harder to digest and may make you feel nauseous or bloated.  Likewise meals heavy in dairy and vegetables can cause extra gas to form and this can also increase the bloating.  Drink plenty of fluids but you should avoid alcoholic beverages for 24 hours.  ACTIVITY: Your care partner should take you home directly after the procedure.  You should plan to take it easy, moving slowly for the rest of the day.  You can resume normal activity the day after the procedure however you should NOT DRIVE or use heavy machinery for 24 hours (because of the sedation medicines used during the test).    SYMPTOMS TO REPORT IMMEDIATELY: A gastroenterologist can be reached at any hour.  During normal business hours, 8:30 AM to 5:00 PM Monday through Friday,  call [phone].  After hours and on weekends, please call the GI answering service at [phone] who will take a message and have the physician on call contact you.   Following lower endoscopy (colonoscopy or flexible sigmoidoscopy):  Excessive amounts of blood in the stool  Significant tenderness or worsening of abdominal pains  Swelling of the abdomen that is new, acute  Fever of 100F or higher    FOLLOW UP: If any biopsies were taken you will be contacted by phone or by letter within the next 1-3 weeks.  Call your gastroenterologist if you have not heard about the biopsies in 3 weeks.  Our staff will call the home number listed on your records the next business day following your procedure to check on you and address any questions or concerns that you may have at that time regarding the information given to you following your procedure. This is a courtesy call and so if there is no answer at the home number and we have not heard from you through the emergency physician on call, we will assume that you have returned to your regular daily activities without incident.  SIGNATURES/CONFIDENTIALITY: You and/or your care partner have signed paperwork which will be entered into your electronic medical record.  These signatures attest to the fact that that the information above on your After Visit Summary has been reviewed and is understood.  Full responsibility of the confidentiality   of this discharge information lies with you and/or your care-partner.  Polyp, diverticulosis, and high fiber diet information given.  Dr. Hilarie Fredrickson will advise you about recall for next colonscopy after pathology is reviewed.

## 2013-11-24 ENCOUNTER — Telehealth: Payer: Self-pay

## 2013-11-24 NOTE — Telephone Encounter (Signed)
Left message on answering machine. 

## 2013-11-25 ENCOUNTER — Encounter: Payer: Self-pay | Admitting: Internal Medicine

## 2014-02-03 ENCOUNTER — Ambulatory Visit (INDEPENDENT_AMBULATORY_CARE_PROVIDER_SITE_OTHER): Payer: 59 | Admitting: Medical

## 2014-02-03 ENCOUNTER — Encounter: Payer: Self-pay | Admitting: Medical

## 2014-02-03 VITALS — BP 110/80 | HR 76 | Temp 97.9°F | Resp 14 | Wt 197.0 lb

## 2014-02-03 DIAGNOSIS — H1045 Other chronic allergic conjunctivitis: Secondary | ICD-10-CM

## 2014-02-03 DIAGNOSIS — J309 Allergic rhinitis, unspecified: Secondary | ICD-10-CM

## 2014-02-03 DIAGNOSIS — H1010 Acute atopic conjunctivitis, unspecified eye: Secondary | ICD-10-CM

## 2014-02-03 MED ORDER — FEXOFENADINE HCL 180 MG PO TABS: 180.0000 mg | ORAL_TABLET | Freq: Every day | ORAL | Status: DC

## 2014-02-03 MED ORDER — AZELASTINE HCL 0.05 % OP SOLN: 1.0000 [drp] | Freq: Two times a day (BID) | OPHTHALMIC | Status: DC

## 2014-02-03 NOTE — Progress Notes (Signed)
Subjective: Here for possible allergic reaction.  He notes 2-3 weeks of itchy water eyes, thought he had pink eye, but no goupy eyes or crusting.  Used Vysine.  Still has sneezing.  No runny nose or cough.  +post nasal drainage.  Skin at times feels itchy.  Has some blisters from time to time on arms x 1 month ago, itchy too.  Thought this was related to Lipitor and Vitamins at the time.  No fever, no NVD, no new chemicals.  No SOB, no chest pain, no lip swelling.  Works at [employer], around tobacco dust.  No other aggravating or relieving factors.  No other c/o.   Past Medical History  Diagnosis Date  . ED (erectile dysfunction) 2008  . Dyslipidemia   . Keloid of skin   . History of EKG 1/15  . Status post LASIK surgery   . Hyperlipidemia    ROS as in subjective   Objective: Filed Vitals:   02/03/14 0838  BP: 110/80  Pulse: 76  Temp: 97.9 F (36.6 C)  Resp: 14    General appearance: alert, no distress, WD/WN Skin: few healing lesions on bilat forearms suggestive of recent linear poison ivy dermatitis HEENT: normocephalic, sclerae anicteric, mild bilat conjunctival injection,TMs pearly, nares patent, no discharge or erythema, pharynx normal Oral cavity: MMM, no lesions Neck: supple, no lymphadenopathy, no thyromegaly, no masses Lungs: CTA bilaterally, no wheezes, rhonchi, or rales Pulses: 2+ symmetric, upper and lower extremities, normal cap refill Ext: edema  Assessment: Encounter Diagnoses  Name Primary?  . Allergic rhinitis Yes  . Allergic conjunctivitis    Plan: Begin Allegra, allergy eye drop, bath every evening to wash off pollen, consider nasal saline, and recheck if not improving.  Restart Lipitor as this is not the cause of the symptoms.

## 2014-02-03 NOTE — Patient Instructions (Signed)
  Thank you for giving me the opportunity to serve you today.    Your diagnosis today includes: Encounter Diagnoses  Name Primary?  . Allergic rhinitis Yes  . Allergic conjunctivitis      Specific recommendations today include:  Begin allergy eye drop  Begin allergy tablet at bedtime  Use either the 2 prescriptions I sent, or have pharmacist help you get Allegra tablets and OTC allergy eye drop  Take a shower every evening to wash off pollen  Consider nasal saline at bedtime

## 2014-10-08 ENCOUNTER — Telehealth: Payer: Self-pay | Admitting: Medical

## 2014-10-08 NOTE — Telephone Encounter (Signed)
See Kathy's note, pt has appt 10/15/14

## 2014-10-08 NOTE — Telephone Encounter (Signed)
Pt called and needs refills on viagra sent to [facility]. Pt has an appt on the 10/15/2014.

## 2014-10-08 NOTE — Telephone Encounter (Signed)
Due at this time for routine physical, last physical 1/ 2016. So schedule this please and we can refill/discuss then

## 2014-10-09 ENCOUNTER — Other Ambulatory Visit: Payer: Self-pay | Admitting: Medical

## 2014-10-09 MED ORDER — SILDENAFIL CITRATE 100 MG PO TABS: 100.0000 mg | ORAL_TABLET | Freq: Every day | ORAL | Status: DC | PRN

## 2014-10-12 NOTE — Telephone Encounter (Signed)
Juan Fleming, see message below

## 2014-10-15 ENCOUNTER — Encounter: Payer: Self-pay | Admitting: Medical

## 2014-10-27 ENCOUNTER — Encounter: Payer: Self-pay | Admitting: Medical

## 2016-02-22 ENCOUNTER — Encounter: Payer: Self-pay | Admitting: Medical

## 2016-02-22 ENCOUNTER — Ambulatory Visit (INDEPENDENT_AMBULATORY_CARE_PROVIDER_SITE_OTHER): Payer: Commercial Managed Care - HMO | Admitting: Medical

## 2016-02-22 VITALS — BP 120/80 | HR 86 | Ht 69.0 in | Wt 194.0 lb

## 2016-02-22 DIAGNOSIS — Z125 Encounter for screening for malignant neoplasm of prostate: Secondary | ICD-10-CM

## 2016-02-22 DIAGNOSIS — L989 Disorder of the skin and subcutaneous tissue, unspecified: Secondary | ICD-10-CM | POA: Diagnosis not present

## 2016-02-22 DIAGNOSIS — N529 Male erectile dysfunction, unspecified: Secondary | ICD-10-CM

## 2016-02-22 DIAGNOSIS — R29898 Other symptoms and signs involving the musculoskeletal system: Secondary | ICD-10-CM

## 2016-02-22 DIAGNOSIS — M7661 Achilles tendinitis, right leg: Secondary | ICD-10-CM

## 2016-02-22 DIAGNOSIS — Z0000 Encounter for general adult medical examination without abnormal findings: Secondary | ICD-10-CM | POA: Diagnosis not present

## 2016-02-22 LAB — COMPREHENSIVE METABOLIC PANEL
ALBUMIN: 4.2 g/dL (ref 3.6–5.1)
ALT: 15 U/L (ref 9–46)
AST: 17 U/L (ref 10–35)
Alkaline Phosphatase: 78 U/L (ref 40–115)
BUN: 13 mg/dL (ref 7–25)
CALCIUM: 9.7 mg/dL (ref 8.6–10.3)
CHLORIDE: 101 mmol/L (ref 98–110)
CO2: 25 mmol/L (ref 20–31)
CREATININE: 1.2 mg/dL (ref 0.70–1.33)
GLUCOSE: 84 mg/dL (ref 65–99)
Potassium: 4.1 mmol/L (ref 3.5–5.3)
SODIUM: 138 mmol/L (ref 135–146)
Total Bilirubin: 0.5 mg/dL (ref 0.2–1.2)
Total Protein: 7 g/dL (ref 6.1–8.1)

## 2016-02-22 LAB — CK: Total CK: 103 U/L (ref 7–232)

## 2016-02-22 LAB — LIPID PANEL
CHOL/HDL RATIO: 3.8 ratio (ref <=5.0)
CHOLESTEROL: 223 mg/dL — AB (ref 125–200)
HDL: 58 mg/dL (ref >=40)
LDL Cholesterol: 129 mg/dL (ref <130)
TRIGLYCERIDES: 180 mg/dL — AB (ref <150)
VLDL: 36 mg/dL — AB (ref <30)

## 2016-02-22 LAB — POCT URINALYSIS DIPSTICK
Bilirubin, UA: NEGATIVE
Blood, UA: NEGATIVE
GLUCOSE UA: NEGATIVE
Ketones, UA: NEGATIVE
Leukocytes, UA: NEGATIVE
NITRITE UA: NEGATIVE
PROTEIN UA: NEGATIVE
Spec Grav, UA: 1.025
UROBILINOGEN UA: NEGATIVE
pH, UA: 6

## 2016-02-22 LAB — CBC
HCT: 44.5 % (ref 38.5–50.0)
Hemoglobin: 14.9 g/dL (ref 13.2–17.1)
MCH: 27.7 pg (ref 27.0–33.0)
MCHC: 33.5 g/dL (ref 32.0–36.0)
MCV: 82.7 fL (ref 80.0–100.0)
MPV: 9 fL (ref 7.5–12.5)
PLATELETS: 279 10*3/uL (ref 140–400)
RBC: 5.38 MIL/uL (ref 4.20–5.80)
RDW: 14.4 % (ref 11.0–15.0)
WBC: 5.8 10*3/uL (ref 4.0–10.5)

## 2016-02-22 MED ORDER — SILDENAFIL CITRATE 100 MG PO TABS
100.0000 mg | ORAL_TABLET | Freq: Every day | ORAL | Status: DC | PRN
Start: 2016-02-22 — End: 2017-09-12

## 2016-02-22 NOTE — Progress Notes (Signed)
Subjective:   HPI  Juan Fleming is a 55 y.o. male who presents for a complete physical.     Concerns: ED - does ok on Viagra.  Wants refill.  Uses occasionally  Has some intermittent pains of right posterior foot/achilles area.  No particular injury or trauma.   Has skin lesion on top of head  Reviewed their medical, surgical, family, social, medication, and allergy history and updated chart as appropriate.  Past Medical History  Diagnosis Date  . ED (erectile dysfunction) 2008  . Dyslipidemia   . Keloid of skin   . History of EKG 1/15  . Status post LASIK surgery   . Hyperlipidemia     Past Surgical History  Procedure Laterality Date  . Lasik Bilateral 2008  . Colonoscopy  11/2013    tubular adenoma, Dr. Zenovia Fleming    Social History   Social History  . Marital Status: Married    Spouse Name: N/A  . Number of Children: N/A  . Years of Education: N/A   Occupational History  . maintenance [employer]   Social History Main Topics  . Smoking status: Never Smoker   . Smokeless tobacco: Never Used  . Alcohol Use: No     Comment: occasional on a holiday  . Drug Use: No  . Sexual Activity: Not on file   Other Topics Concern  . Not on file   Social History Narrative   Married, 2 children, Exercise -frequently, treadmill, weights.  Works at [employer], [employer]    Family History  Problem Relation Age of Onset  . Diabetes Mother   . Hypertension Father   . Thyroid disease Sister   . Stroke Neg Hx   . Heart disease Neg Hx   . Hyperlipidemia Neg Hx   . Cancer Neg Hx   . Colon cancer Neg Hx   . Rectal cancer Neg Hx   . Stomach cancer Neg Hx   . Arthritis Brother      Current outpatient prescriptions:  .  sildenafil (VIAGRA) 100 MG tablet, Take 1 tablet (100 mg total) by mouth daily as needed. 1/2 tablet daily prn (Patient not taking: Reported on 02/22/2016), Disp: 10 tablet, Rfl: 0  No Known Allergies   Review of Systems Constitutional: -fever,  -chills, -sweats, -unexpected weight change, -decreased appetite, -fatigue Allergy: -sneezing, -itching, -congestion Dermatology: +changing moles, --rash, -lumps ENT: -runny nose, -ear pain, -sore throat, -hoarseness, -sinus pain, -teeth pain, - ringing in ears, -hearing loss, -nosebleeds Cardiology: -chest pain, -palpitations, -swelling, -difficulty breathing when lying flat, -waking up short of breath Respiratory: -cough, -shortness of breath, -difficulty breathing with exercise or exertion, -wheezing, -coughing up blood Gastroenterology: -abdominal pain, -nausea, -vomiting, -diarrhea, -constipation, -blood in stool, -changes in bowel movement, -difficulty swallowing or eating Hematology: -bleeding, -bruising  Musculoskeletal: +joint aches, -muscle aches, -joint swelling, -back pain, -neck pain, -cramping, -changes in gait, Ophthalmology: denies vision changes, eye redness, itching, discharge Urology: -burning with urination, -difficulty urinating, -blood in urine, -urinary frequency, -urgency, -incontinence Neurology: -headache, -weakness, -tingling, -numbness, -memory loss, -falls, -dizziness Psychology: -depressed mood, -agitation, -sleep problems     Objective:   Physical Exam  BP 120/80 mmHg  Pulse 86  Ht 5\' 9"  (1.753 m)  Wt 194 lb (87.998 kg)  BMI 28.64 kg/m2  General appearance: alert, no distress, WD/WN, AA male Skin: left superior scalp lesion (bald) with round smooth well demarcated lesion that is flat, no worrisome findings, scattered benign appearing lesions, no worrisome lesions HEENT: normocephalic, conjunctiva/corneas normal,  sclerae anicteric, PERRLA, EOMi, nares patent, no discharge or erythema, pharynx normal Oral cavity: MMM, tongue normal, teeth in good repair Neck: supple, no lymphadenopathy, no thyromegaly, no masses, normal ROM, no bruits Chest: non tender, normal shape and expansion Heart: RRR, normal S1, S2, no murmurs Lungs: CTA bilaterally, no wheezes,  rhonchi, or rales Abdomen: +bs, soft, non tender, non distended, no masses, no hepatomegaly, no splenomegaly, no bruits Back: non tender, normal ROM, no scoliosis Musculoskeletal: tender over right achilles insertion, otherwise bony tibial tuberosities suggestive of prior osgood slatters, upper extremities non tender, no obvious deformity, normal ROM throughout, lower extremities non tender, no obvious deformity, normal ROM throughout Extremities: no edema, no cyanosis, no clubbing Pulses: 2+ symmetric, upper and lower extremities, normal cap refill Neurological: alert, oriented x 3, CN2-12 intact, strength normal upper extremities and lower extremities, sensation normal throughout, DTRs 2+ throughout, no cerebellar signs, gait normal Psychiatric: normal affect, behavior normal, pleasant  GU: normal male external genitalia, circumcised, somewhat hypogonadal on exam, nontender, no masses, no hernia, no lymphadenopathy Rectal: deferred at his request   Assessment and Plan :      Encounter Diagnoses  Name Primary?  . Routine general medical examination at a health care facility Yes  . Arm weakness   . Right Achilles tendinitis   . Skin lesion of scalp   . Erectile dysfunction, unspecified erectile dysfunction type   . Screening for prostate cancer     Physical exam - discussed healthy lifestyle, diet, exercise, preventative care, vaccinations, and addressed their concerns.  I advised he see eye doctor and dentist yearly. See your eye doctor yearly for routine vision care. See your dentist yearly for routine dental care including hygiene visits twice yearly. Erectile dysfunction-c/t Viagra prn. Advised yearly flu shot Achilles tendonitis - discussed ice, relative rest, compression, NSAID prn, return if this continues to give him problems Arm weakness - likely deconditioning.  Discussed gradual strength training program.  Labs today Skin lesion of scalp - reassured F/u pending  labs.  Juan Fleming was seen today for annual exam.  Diagnoses and all orders for this visit:  Routine general medical examination at a health care facility -     POCT urinalysis dipstick -     Lipid panel -     CBC -     PSA -     TSH -     Comprehensive metabolic panel -     CK -     Testosterone  Arm weakness -     CK -     Testosterone  Right Achilles tendinitis  Skin lesion of scalp  Erectile dysfunction, unspecified erectile dysfunction type  Screening for prostate cancer -     PSA

## 2016-02-23 LAB — TSH: TSH: 2.06 mIU/L (ref 0.40–4.50)

## 2016-02-23 LAB — PSA: PSA: 1.1 ng/mL (ref <=4.00)

## 2016-02-23 LAB — TESTOSTERONE: Testosterone: 177 ng/dL — ABNORMAL LOW (ref 250–827)

## 2016-02-28 ENCOUNTER — Ambulatory Visit: Payer: Commercial Managed Care - HMO | Admitting: Medical

## 2016-05-05 ENCOUNTER — Ambulatory Visit (INDEPENDENT_AMBULATORY_CARE_PROVIDER_SITE_OTHER): Payer: Commercial Managed Care - HMO | Admitting: Medical

## 2016-05-05 ENCOUNTER — Encounter: Payer: Self-pay | Admitting: Medical

## 2016-05-05 VITALS — BP 122/80 | HR 90 | Wt 194.0 lb

## 2016-05-05 DIAGNOSIS — E291 Testicular hypofunction: Secondary | ICD-10-CM

## 2016-05-05 DIAGNOSIS — M7661 Achilles tendinitis, right leg: Secondary | ICD-10-CM

## 2016-05-05 DIAGNOSIS — M79671 Pain in right foot: Secondary | ICD-10-CM | POA: Diagnosis not present

## 2016-05-05 DIAGNOSIS — R7989 Other specified abnormal findings of blood chemistry: Secondary | ICD-10-CM

## 2016-05-05 DIAGNOSIS — R5382 Chronic fatigue, unspecified: Secondary | ICD-10-CM | POA: Diagnosis not present

## 2016-05-05 MED ORDER — TESTOSTERONE 50 MG/5GM (1%) TD GEL: 5.0000 g | Freq: Every day | TRANSDERMAL | 1 refills | Status: DC

## 2016-05-05 NOTE — Progress Notes (Signed)
Subjective:     Patient ID: Juan Fleming, male   DOB: 03-25-1961, 55 y.o.   MRN: [id number] HPI Here to discuss recent labs from his physical.   His testosterone was low.  He notes no prior hx/o low T.   He does note fatigue, decreased energy, muscle weakness as if he can't gain muscle or do dips or weights like he use to.  Sometimes issues with erections  Still having the same pain in back of right foot, achilles.  Worse getting out of bed or if hitting the posterior portion of the foot.  Has used NSAID, ice, but nothing helps. No other aggravating or relieving factors. No other complaint.   Review of Systems As in subjective     Objective:   Physical Exam  Gen: wd, wn, nad Tender over right achilles insertion without swelling or deformity, otherwise foot non tender, normal ROM of foot/ankle. Feet neurovascularly intact    Assessment:     Encounter Diagnoses  Name Primary?  . Low testosterone Yes  . Hypogonadism in male   . Chronic fatigue   . Foot pain, right   . Achilles tendinitis of right lower extremity        Plan:     Low T, hypogonadism, fatigue - repeat labs today and assuming low T and other labs normal, he will begin Testium .  discussed proper use of medication, safety, precautions, and expected results.   He didn't seem interest in injections at this time.   Foot pain, achilles tendonitis - not improving with conservative therapy, referral to ortho  [name] was seen today for follow-up.  Diagnoses and all orders for this visit:  Low testosterone -     FSH/LH -     Testosterone -     Prolactin  Hypogonadism in male -     FSH/LH -     Testosterone -     Prolactin  Chronic fatigue -     Testosterone  Foot pain, right -     Ambulatory referral to Orthopedic Surgery  Achilles tendinitis of right lower extremity -     Ambulatory referral to Orthopedic Surgery  Other orders -     testosterone (TESTIM) 50 MG/5GM (1%) GEL; Place 5 g onto the skin  daily. 1 pump each shoulder daily

## 2016-05-06 LAB — TESTOSTERONE: TESTOSTERONE: 303 ng/dL (ref 250–827)

## 2016-05-06 LAB — FSH/LH
FSH: 1.5 m[IU]/mL — AB (ref 1.6–8.0)
LH: 1 m[IU]/mL — AB (ref 1.5–9.3)

## 2016-05-06 LAB — PROLACTIN: Prolactin: 11.2 ng/mL (ref 2.0–18.0)

## 2016-05-23 ENCOUNTER — Encounter: Payer: Self-pay | Admitting: Medical

## 2016-05-23 ENCOUNTER — Ambulatory Visit (INDEPENDENT_AMBULATORY_CARE_PROVIDER_SITE_OTHER): Payer: Commercial Managed Care - HMO | Admitting: Medical

## 2016-05-23 VITALS — BP 128/80 | HR 88 | Resp 16 | Ht 70.0 in | Wt 197.0 lb

## 2016-05-23 DIAGNOSIS — N529 Male erectile dysfunction, unspecified: Secondary | ICD-10-CM

## 2016-05-23 DIAGNOSIS — E291 Testicular hypofunction: Secondary | ICD-10-CM

## 2016-05-23 DIAGNOSIS — R5382 Chronic fatigue, unspecified: Secondary | ICD-10-CM

## 2016-05-23 DIAGNOSIS — R7989 Other specified abnormal findings of blood chemistry: Secondary | ICD-10-CM | POA: Insufficient documentation

## 2016-05-23 MED ORDER — TESTOSTERONE 50 MG/5GM (1%) TD GEL: 5.0000 g | Freq: Every day | TRANSDERMAL | 1 refills | Status: DC

## 2016-05-23 NOTE — Progress Notes (Addendum)
Subjective:     Patient ID: Juan Fleming, male   DOB: 09-14-1961, 55 y.o.   MRN: [id number] HPI Here to discuss recent labs regarding low testosterone.  He notes no prior hx/o low T.   He does note fatigue, decreased energy, muscle weakness as if he can't gain muscle or do dips or weights like he use to.  Sometimes issues with erections  Review of Systems As in subjective     Objective:   Physical Exam  Gen: wd, wn, nad     Assessment:     Encounter Diagnoses  Name Primary?  . Hypogonadism in male Yes  . Erectile dysfunction, unspecified erectile dysfunction type   . Low testosterone   . Chronic fatigue        Plan:     Low T, hypogonadism, fatigue - reviewed recent labs.    Waiting on call back from Urology regarding [facility] and [facility] labs.   The plan will be to start Testim gel.  Discussed proper use of medication, safety, precautions, and expected results.   He didn't seem interest in injections at this time.   Kenson was seen today for follow-up.  Diagnoses and all orders for this visit:  Hypogonadism in male  Erectile dysfunction, unspecified erectile dysfunction type  Low testosterone  Chronic fatigue  Other orders -     testosterone (TESTIM) 50 MG/5GM (1%) GEL; Place 5 g onto the skin daily. 1 pump each shoulder daily

## 2016-05-23 NOTE — Addendum Note (Signed)
Addended by: Carlena Hurl on: 05/23/2016 11:43 AM   Modules accepted: Orders

## 2016-05-24 ENCOUNTER — Telehealth: Payer: Self-pay | Admitting: Medical

## 2016-05-24 DIAGNOSIS — E291 Testicular hypofunction: Secondary | ICD-10-CM

## 2016-05-24 DIAGNOSIS — R7989 Other specified abnormal findings of blood chemistry: Secondary | ICD-10-CM

## 2016-05-24 NOTE — Telephone Encounter (Signed)
Please call patient.  After speaking to Urology, Dr. Karsten Ro yesterday, given his abnormal [facility] and [facility] labs, he does in fact recommend a head scan to rule out a brain cause of his testosterone being low.   There is a small chance of an abnormality and we don't want to miss this.  If agreeable, please refer for MRI brain with or with out contrast whichever radiology recommends for hypogonadism, low testosterone, low FSH and LH.    Thanks [employer]

## 2016-05-24 NOTE — Telephone Encounter (Signed)
Attempted call to pt- no VCM available.  

## 2016-05-25 NOTE — Telephone Encounter (Signed)
Attempted call to pt to notify him that MRI does not require prior auth. [facility] will contact pt to schedule MRI.   No answer, no VCM.

## 2016-05-29 NOTE — Telephone Encounter (Signed)
Phone number given to pt to call and schedule MRI. Juan Fleming

## 2016-06-06 ENCOUNTER — Other Ambulatory Visit: Payer: Self-pay

## 2016-10-27 DIAGNOSIS — B351 Tinea unguium: Secondary | ICD-10-CM | POA: Diagnosis not present

## 2016-10-27 DIAGNOSIS — M722 Plantar fascial fibromatosis: Secondary | ICD-10-CM | POA: Diagnosis not present

## 2016-10-27 DIAGNOSIS — M7661 Achilles tendinitis, right leg: Secondary | ICD-10-CM | POA: Diagnosis not present

## 2016-10-27 DIAGNOSIS — M71571 Other bursitis, not elsewhere classified, right ankle and foot: Secondary | ICD-10-CM | POA: Diagnosis not present

## 2016-10-27 DIAGNOSIS — M79671 Pain in right foot: Secondary | ICD-10-CM | POA: Diagnosis not present

## 2016-11-27 DIAGNOSIS — M71571 Other bursitis, not elsewhere classified, right ankle and foot: Secondary | ICD-10-CM | POA: Diagnosis not present

## 2016-11-27 DIAGNOSIS — M7661 Achilles tendinitis, right leg: Secondary | ICD-10-CM | POA: Diagnosis not present

## 2016-11-27 DIAGNOSIS — L03032 Cellulitis of left toe: Secondary | ICD-10-CM | POA: Diagnosis not present

## 2016-11-27 DIAGNOSIS — B351 Tinea unguium: Secondary | ICD-10-CM | POA: Diagnosis not present

## 2016-11-27 DIAGNOSIS — M7731 Calcaneal spur, right foot: Secondary | ICD-10-CM | POA: Diagnosis not present

## 2016-11-27 DIAGNOSIS — L03031 Cellulitis of right toe: Secondary | ICD-10-CM | POA: Diagnosis not present

## 2016-12-05 DIAGNOSIS — L821 Other seborrheic keratosis: Secondary | ICD-10-CM | POA: Diagnosis not present

## 2016-12-05 DIAGNOSIS — L720 Epidermal cyst: Secondary | ICD-10-CM | POA: Diagnosis not present

## 2016-12-05 DIAGNOSIS — D234 Other benign neoplasm of skin of scalp and neck: Secondary | ICD-10-CM | POA: Diagnosis not present

## 2017-01-10 ENCOUNTER — Ambulatory Visit (INDEPENDENT_AMBULATORY_CARE_PROVIDER_SITE_OTHER): Payer: Commercial Managed Care - HMO | Admitting: Medical

## 2017-01-10 ENCOUNTER — Encounter: Payer: Self-pay | Admitting: Medical

## 2017-01-10 VITALS — BP 152/90 | HR 95 | Temp 99.0°F | Wt 201.6 lb

## 2017-01-10 DIAGNOSIS — R011 Cardiac murmur, unspecified: Secondary | ICD-10-CM | POA: Diagnosis not present

## 2017-01-10 DIAGNOSIS — B880 Other acariasis: Secondary | ICD-10-CM | POA: Diagnosis not present

## 2017-01-10 DIAGNOSIS — R030 Elevated blood-pressure reading, without diagnosis of hypertension: Secondary | ICD-10-CM

## 2017-01-10 DIAGNOSIS — J3489 Other specified disorders of nose and nasal sinuses: Secondary | ICD-10-CM

## 2017-01-10 DIAGNOSIS — R0982 Postnasal drip: Secondary | ICD-10-CM

## 2017-01-10 MED ORDER — AMOXICILLIN 500 MG PO TABS: ORAL_TABLET | ORAL | 0 refills | Status: DC

## 2017-01-10 MED ORDER — TRIAMCINOLONE ACETONIDE 0.1 % EX CREA: 1.0000 "application " | TOPICAL_CREAM | Freq: Two times a day (BID) | CUTANEOUS | 0 refills | Status: DC

## 2017-01-10 NOTE — Progress Notes (Signed)
Subjective:  Juan Fleming is a 56 y.o. male who presents for possible sinus infection.   Chief Complaint  Patient presents with  . URI    coughing, chills,ear clogged up , runny nose, rash on butt and upper thigh   He notes feeling like choking on mucous.  Been having some sinus pressure for 4+ days, ear pressure, sinus pressure. No sore throat, no fever.  Main c/o drainage in throat worse at night.  No NVD, no wheezing or SOB, no teeth pain.    Also has rash on bilat flank.  Was pulling some brush the other day and has some itchy raised bumps on bilat hips.    Using nothing for symptoms. denies sick contacts.  Patient is not a smoker. No other aggravating or relieving factors.  No other c/o.  Past Medical History:  Diagnosis Date  . Dyslipidemia   . ED (erectile dysfunction) 2008  . History of EKG 1/15  . Hyperlipidemia   . Keloid of skin   . Status post LASIK surgery     ROS as in subjective   Objective: BP (!) 152/90   Pulse 95   Temp 99 F (37.2 C)   Wt 201 lb 9.6 oz (91.4 kg)   SpO2 99%   BMI 28.93 kg/m   BP Readings from Last 3 Encounters:  01/10/17 (!) 152/90  05/23/16 128/80  05/05/16 122/80    General appearance: Alert, WD/WN, no distress                             Skin: warm, left and right posteriolaterl flank with 2 raised pink lesions bilat                           Head: no sinus tenderness,                            Eyes: conjunctiva normal, corneas clear, PERRLA                            Ears: pearly TMs, external ear canals normal                          Nose: septum midline, turbinates swollen, with erythema and clear discharge             Mouth/throat: MMM, tongue normal, mild pharyngeal erythema                           Neck: supple, no adenopathy, no thyromegaly, non tender                          Heart: RRR, normal S1, S2, faint 1-2/6 left upper sternal border brief systolic murmur                         Lungs: CTA bilaterally,  no wheezes, rales, or rhonchi       Assessment  Encounter Diagnoses  Name Primary?  . Sinus pressure Yes  . Post-nasal drainage   . Chigger bites   . Elevated blood-pressure reading without diagnosis of hypertension   . Heart murmur       Plan: discussed symptoms and  exam findings.   Recommendations  Begin OTC Claritin in the morning for allergy symptoms  Begin Benadryl OTC the next week at night time to help with drainage, mucous and allergy/sinus symptoms  increase your water intake  Consider using nasal saline flush with your shower in the evening to clear pollen from the nose  Consider using salt water gargles before bedtime to clear mucous from throat  If worse sinus pain, worse sore throat within a week, you can begin amoxicillin but I think your symptoms are more related to allergies  Your blood pressure was elevated today and I hear a murmur today Avoid decongestants like sudafed and phenylephrine OTC  Start checking your blood pressures at the drug store and write the readings down  Lets plan recheck in a few weeks or physical soon to recheck on this.  [name] was seen today for uri.  Diagnoses and all orders for this visit:  Sinus pressure  Post-nasal drainage  Chigger bites  Elevated blood-pressure reading without diagnosis of hypertension  Heart murmur  Other orders -     triamcinolone cream (KENALOG) 0.1 %; Apply 1 application topically 2 (two) times daily. -     amoxicillin (AMOXIL) 500 MG tablet; 2 tablets po BID x 10 days   Patient was advised to call or return if worse or not improving in the next few days.    Patient voiced understanding of diagnosis, recommendations, and treatment plan.  After visit summary given.

## 2017-01-10 NOTE — Patient Instructions (Signed)
Encounter Diagnoses  Name Primary?  . Sinus pressure Yes  . Post-nasal drainage   . Chigger bites   . Elevated blood-pressure reading without diagnosis of hypertension   . Heart murmur     Recommendations  Begin OTC Claritin in the morning for allergy symptoms  Begin Benadryl OTC the next week at night time to help with drainage, mucous and allergy/sinus symptoms  increase your water intake  Consider using nasal saline flush with your shower in the evening to clear pollen from the nose  Consider using salt water gargles before bedtime to clear mucous from throat  If worse sinus pain, worse sore throat within a week, you can begin amoxicillin but I think your symptoms are more related to allergies  Your blood pressure was elevated today and I hear a murmur today Avoid decongestants like sudafed and phenylephrine OTC  Start checking your blood pressures at the drug store and write the readings down  Lets plan recheck in a few weeks or physical soon to recheck on this.     Using Saline Nose Drops with Bulb Syringe A bulb syringe is used to clear your nose. You may use it when you have a stuffy nose, nasal congestion, sinus pressure, or sneezing.   SALINE SOLUTION You can buy nose drops at your local drug store. You can also make nose drops yourself. Mix 1 cup of water with  teaspoon of salt. Stir. Store this mixture at room temperature. Make a new batch daily.  USE THE BULB IN COMBINATION WITH SALINE NOSE DROPS  Squeeze the air out of the bulb before suctioning the saline mixture.  While still squeezing the bulb flat, place the tip of the bulb into the saline mixture.  Let air come back into the bulb.  This will suction up the saline mixture.  Gently flush one nostril at a time.  Salt water nose drops will then moisten your  congested nose and loosen secretions before suctioning.  Use the bulb syringe as directed below to suction.  USING THE BULB SYRINGE TO  SUCTION  While still squeezing the bulb flat, place the tip of the bulb into a nostril. Let air come back into the bulb. The suction will pull snot out of the nose and into the bulb.  Repeat on the other nostril.  Squeeze syringe several times into a tissue.  CLEANING THE BULB SYRINGE  Clean the bulb syringe every day with hot soapy water.  Clean the inside of the bulb by squeezing the bulb while the tip is in soapy water.  Rinse by squeezing the bulb while the tip is in clean hot water.  Store the bulb with the tip side down on paper towel.  HOME CARE INSTRUCTIONS   Use saline nose drops often to keep the nose open and not stuffy.  Throw away used salt water. Make a new solution every time.  Do not use the same solution and dropper for another person  If you do not prefer to use nasal saline flush, other options include nasal saline spray or the [employer], both of which are available over the counter at your pharmacy.      Heart Murmur A heart murmur is an extra sound that is caused by chaotic blood flow. The murmur can be heard as a "hum" or "whoosh" sound when blood flows through the heart. The heart has four areas called chambers. Valves separate the upper and lower chambers from each other (tricuspid valve and mitral  valve) and separate the lower chambers of the heart from pathways that lead away from the heart (aortic valve and pulmonary valve). Normally, the valves open to let blood flow through or out of your heart, and then they shut to keep the blood from flowing backward. There are two types of heart murmurs:  Innocent murmurs. Most people with this type of heart murmur do not have a heart problem. Many children have innocent heart murmurs. Your health care provider may suggest some basic testing to find out whether your murmur is an innocent murmur. If an innocent heart murmur is found, there is no need for further tests or treatment and no need to restrict activities  or stop playing sports.  Abnormal murmurs. These types of murmurs can occur in children and adults. Abnormal murmurs may be a sign of a more serious heart condition, such as a heart defect present at birth (congenital defect) or heart valve disease. What are the causes? This condition is caused by heart valves that are not working properly. In children, abnormal heart murmurs are typically caused by congenital defects. In adults, abnormal murmurs are usually from heart valve problems caused by disease, infection, or aging. Three types of heart valve defects can cause a murmur:  Regurgitation. This is when blood leaks back through the valve in the wrong direction.  Mitral valve prolapse. This is when the mitral valve of the heart has a loose flap and does not close tightly.  Stenosis. This is when a valve does not open enough and blocks blood flow. This condition may also be caused by:  Pregnancy.  Fever.  Overactive thyroid gland.  Anemia.  Exercise.  Rapid growth spurts (in children). What are the signs or symptoms? Innocent murmurs do not cause symptoms, and many people with abnormal murmurs may or may not have symptoms. If symptoms do develop, they may include:  Shortness of breath.  Blue coloring of the skin, especially on the fingertips.  Chest pain.  Palpitations, or feeling a fluttering or skipped heartbeat.  Fainting.  Persistent cough.  Getting tired much faster than expected.  Swelling in the abdomen, feet, or ankles. How is this diagnosed? This condition may be diagnosed during a routine physical or other exam. If your health care provider hears a murmur with a stethoscope, he or she will listen for:  Where the murmur is located in your heart.  How long the murmur lasts (duration).  When the murmur is heard during the heartbeat.  How loud the murmur is. This may help the health care provider figure out what is causing the murmur. You may be referred to a  heart specialist (cardiologist). You may also have other tests, including:  Electrocardiogram (ECG or EKG). This test measures the electrical activity of your heart.  Echocardiogram. This test uses high frequency sound waves to make pictures of your heart.  MRI or chest X-ray.  Cardiac catheterization. This test looks at blood flow through the heart. For children and adults who have an abnormal heart murmur and want to stay active, it is important to complete testing, review test results, and receive recommendations from your health care provider. If heart disease is present, it may not be safe to play or be active. How is this treated? Heart murmurs themselves do not need treatment. In some cases, a heart murmur may go away on its own. If an underlying problem or disease is causing the murmur, you may need treatment. If treatment is needed, it will  depend on the type and severity of the disease or heart problem causing the murmur. Treatment may include:  Medicine.  Surgery.  Dietary and lifestyle changes. Follow these instructions at home:  Talk with your health care provider before participating in sports or other activities that require a lot of effort and energy (are strenuous).  Learn as much as possible about your condition and any related diseases. Ask your health care provider if you may at risk for any medical emergencies.  Talk with your health care provider about what symptoms you should look out for.  It is up to you to get your test results. Ask your health care provider, or the department that is doing the test, when your results will be ready.  Keep all follow-up visits as told by your health care provider. This is important. Contact a health care provider if:  You feel light-headed.  You are frequently short of breath.  You feel more tired than usual.  You are having a hard time keeping up with normal activities or fitness routines.  You have swelling in your  ankles or feet.  You have chest pain.  You notice that your heart often beats irregularly.  You develop any new symptoms. Get help right away if:  You develop severe chest pain.  You are having trouble breathing.  You have fainting spells.  Your symptoms suddenly get worse. These symptoms may represent a serious problem that is an emergency. Do not wait to see if the symptoms will go away. Get medical help right away. Call your local emergency services (911 in the [place] Do not drive yourself to the hospital. Summary  Normally, the heart valves open to let blood flow through or out of your heart, and then they shut to keep the blood from flowing backward.  Heart murmur is caused by heart valves that are not working properly.  You may need treatment if an underlying problem or disease is causing the heart murmur. Treatment may include medicine, surgery, or dietary and lifestyle changes.  Talk with your health care provider before participating in sports or other activities that require a lot of effort and energy (are strenuous).  Talk with your health care provider about what symptoms you should watch out for. This information is not intended to replace advice given to you by your health care provider. Make sure you discuss any questions you have with your health care provider. Document Released: 10/12/2004 Document Revised: 08/23/2016 Document Reviewed: 08/23/2016 Elsevier Interactive Patient Education  2017 [employer].

## 2017-02-14 DIAGNOSIS — S0101XA Laceration without foreign body of scalp, initial encounter: Secondary | ICD-10-CM | POA: Diagnosis not present

## 2017-02-27 ENCOUNTER — Encounter: Payer: Commercial Managed Care - HMO | Admitting: Medical

## 2017-09-12 ENCOUNTER — Ambulatory Visit: Payer: 59 | Admitting: Medical

## 2017-09-12 ENCOUNTER — Encounter: Payer: Self-pay | Admitting: Medical

## 2017-09-12 VITALS — BP 136/82 | HR 88 | Ht 69.0 in | Wt 204.8 lb

## 2017-09-12 DIAGNOSIS — Z9889 Other specified postprocedural states: Secondary | ICD-10-CM

## 2017-09-12 DIAGNOSIS — Z1211 Encounter for screening for malignant neoplasm of colon: Secondary | ICD-10-CM

## 2017-09-12 DIAGNOSIS — R0683 Snoring: Secondary | ICD-10-CM | POA: Insufficient documentation

## 2017-09-12 DIAGNOSIS — M79671 Pain in right foot: Secondary | ICD-10-CM | POA: Diagnosis not present

## 2017-09-12 DIAGNOSIS — M7660 Achilles tendinitis, unspecified leg: Secondary | ICD-10-CM | POA: Diagnosis not present

## 2017-09-12 DIAGNOSIS — N529 Male erectile dysfunction, unspecified: Secondary | ICD-10-CM | POA: Diagnosis not present

## 2017-09-12 DIAGNOSIS — E669 Obesity, unspecified: Secondary | ICD-10-CM

## 2017-09-12 DIAGNOSIS — E291 Testicular hypofunction: Secondary | ICD-10-CM | POA: Diagnosis not present

## 2017-09-12 DIAGNOSIS — G8929 Other chronic pain: Secondary | ICD-10-CM

## 2017-09-12 DIAGNOSIS — R0681 Apnea, not elsewhere classified: Secondary | ICD-10-CM | POA: Diagnosis not present

## 2017-09-12 DIAGNOSIS — E785 Hyperlipidemia, unspecified: Secondary | ICD-10-CM | POA: Diagnosis not present

## 2017-09-12 DIAGNOSIS — Z0000 Encounter for general adult medical examination without abnormal findings: Secondary | ICD-10-CM | POA: Diagnosis not present

## 2017-09-12 DIAGNOSIS — Z125 Encounter for screening for malignant neoplasm of prostate: Secondary | ICD-10-CM | POA: Insufficient documentation

## 2017-09-12 LAB — POCT URINALYSIS DIP (PROADVANTAGE DEVICE)
Bilirubin, UA: NEGATIVE
Blood, UA: NEGATIVE
Glucose, UA: NEGATIVE mg/dL
Ketones, POC UA: NEGATIVE mg/dL
LEUKOCYTES UA: NEGATIVE
Nitrite, UA: NEGATIVE
PROTEIN UA: NEGATIVE mg/dL
SPECIFIC GRAVITY, URINE: 1.03
UUROB: NEGATIVE
pH, UA: 6 (ref 5.0–8.0)

## 2017-09-12 MED ORDER — SILDENAFIL CITRATE 100 MG PO TABS: 100.0000 mg | ORAL_TABLET | Freq: Every day | ORAL | 11 refills | Status: DC | PRN

## 2017-09-12 NOTE — Progress Notes (Signed)
Subjective:   HPI  Juan Fleming is a 56 y.o. male who presents for a complete physical.     Concerns: He realizes he has a sweet addiction.  Wants to do better with weight in 2019.   He is always active, but not specific exercise regimen or diet discretion  Daughters 5yo [facility] for nursing) and 56yo ([employer] for nursing) is doing vegan diet.   Has chronic foot pain over achilles, x years.   [facility] has injected this area prior  There is concern for sleep apnea, snorning and possible witnessed apnea spell.  Reviewed their medical, surgical, family, social, medication, and allergy history and updated chart as appropriate.  Past Medical History:  Diagnosis Date  . Dyslipidemia   . ED (erectile dysfunction) 2008  . History of EKG 1/15  . Hyperlipidemia   . Keloid of skin   . Status post LASIK surgery     Past Surgical History:  Procedure Laterality Date  . COLONOSCOPY  11/2013   tubular adenoma, Dr. Zenovia Jarred  . LASIK Bilateral 2008    Social History   Socioeconomic History  . Marital status: Married    Spouse name: Not on file  . Number of children: Not on file  . Years of education: Not on file  . Highest education level: Not on file  Social Needs  . Financial resource strain: Not on file  . Food insecurity - worry: Not on file  . Food insecurity - inability: Not on file  . Transportation needs - medical: Not on file  . Transportation needs - non-medical: Not on file  Occupational History  . Occupation: maintenance    Employer: [employer]  Tobacco Use  . Smoking status: Never Smoker  . Smokeless tobacco: Never Used  Substance and Sexual Activity  . Alcohol use: No    Alcohol/week: 0.0 oz    Comment: occasional on a holiday  . Drug use: No  . Sexual activity: Not on file  Other Topics Concern  . Not on file  Social History Narrative   Married, 2 children, Exercise -frequently, treadmill, weights.  Works at [employer], [employer]    Family  History  Problem Relation Age of Onset  . Diabetes Mother   . Hypertension Father   . Thyroid disease Sister   . Arthritis Brother   . Stroke Neg Hx   . Heart disease Neg Hx   . Hyperlipidemia Neg Hx   . Cancer Neg Hx   . Colon cancer Neg Hx   . Rectal cancer Neg Hx   . Stomach cancer Neg Hx      Current Outpatient Medications:  .  sildenafil (VIAGRA) 100 MG tablet, Take 1 tablet (100 mg total) by mouth daily as needed. 1/2 tablet daily prn, Disp: 10 tablet, Rfl: 11  No Known Allergies   Review of Systems Constitutional: -fever, -chills, -sweats, -unexpected weight change, -decreased appetite, -fatigue Allergy: -sneezing, -itching, -congestion Dermatology:  -changing moles, --rash, -lumps ENT: -runny nose, -ear pain, -sore throat, -hoarseness, -sinus pain, -teeth pain, - ringing in ears, -hearing loss, -nosebleeds Cardiology: -chest pain, -palpitations, -swelling, -difficulty breathing when lying flat, -waking up short of breath Respiratory: -cough, -shortness of breath, -difficulty breathing with exercise or exertion, -wheezing, -coughing up blood Gastroenterology: -abdominal pain, -nausea, -vomiting, -diarrhea, -constipation, -blood in stool, -changes in bowel movement, -difficulty swallowing or eating Hematology: -bleeding, -bruising  Musculoskeletal: +joint aches, -muscle aches, -joint swelling, -back pain, -neck pain, -cramping, -changes in gait, Ophthalmology: denies vision  changes, eye redness, itching, discharge Urology: -burning with urination, -difficulty urinating, -blood in urine, -urinary frequency, -urgency, -incontinence Neurology: -headache, -weakness, -tingling, -numbness, -memory loss, -falls, -dizziness Psychology: -depressed mood, -agitation, -sleep problems     Objective:   Physical Exam  BP 136/82   Pulse 88   Ht 5\' 9"  (1.753 m)   Wt 204 lb 12.8 oz (92.9 kg)   SpO2 97%   BMI 30.24 kg/m     Wt Readings from Last 3 Encounters:  09/12/17 204 lb  12.8 oz (92.9 kg)  01/10/17 201 lb 9.6 oz (91.4 kg)  05/23/16 197 lb (89.4 kg)   BP Readings from Last 3 Encounters:  09/12/17 136/82  01/10/17 (!) 152/90  05/23/16 128/80   General appearance: alert, no distress, WD/WN, AA male Skin: left superior scalp lesion (bald) with round smooth well demarcated lesion that is flat, no worrisome findings, scattered benign appearing lesions, no worrisome lesions HEENT: normocephalic, conjunctiva/corneas normal, sclerae anicteric, PERRLA, EOMi, nares patent, no discharge or erythema, pharynx normal Oral cavity: MMM, tongue normal, teeth in good repair Neck: supple, no lymphadenopathy, no thyromegaly, no masses, normal ROM, no bruits Chest: non tender, normal shape and expansion Heart: RRR, normal S1, S2, no murmurs Lungs: CTA bilaterally, no wheezes, rhonchi, or rales Abdomen: +bs, soft, non tender, non distended, no masses, no hepatomegaly, no splenomegaly, no bruits Back: non tender, normal ROM, no scoliosis Musculoskeletal: tender over right achilles insertion, otherwise bony tibial tuberosities suggestive of prior osgood slatters both knees, upper extremities non tender, no obvious deformity, normal ROM throughout, lower extremities non tender, no obvious deformity, normal ROM throughout Extremities: no edema, no cyanosis, no clubbing Pulses: 2+ symmetric, upper and lower extremities, normal cap refill Neurological: alert, oriented x 3, CN2-12 intact, strength normal upper extremities and lower extremities, sensation normal throughout, DTRs 2+ throughout, no cerebellar signs, gait normal Psychiatric: normal affect, behavior normal, pleasant  GU: normal male external genitalia, circumcised, somewhat hypogonadal on exam, nontender, no masses, no hernia, no lymphadenopathy Rectal: anus normal tone, prostate WNL    Assessment and Plan :    Encounter Diagnoses  Name Primary?  . Routine general medical examination at a health care facility Yes  .  Erectile dysfunction, unspecified erectile dysfunction type   . Hypogonadism in male   . Hyperlipidemia, unspecified hyperlipidemia type   . Obesity with serious comorbidity, unspecified classification, unspecified obesity type   . S/P LASIK surgery   . Screen for colon cancer   . Chronic foot pain, right   . Achilles tendon pain   . Snoring   . Witnessed apneic spells    Physical exam - discussed healthy lifestyle, diet, exercise, preventative care, vaccinations, and addressed their concerns.  I advised he see eye doctor and dentist yearly. See your eye doctor yearly for routine vision care. See your dentist yearly for routine dental care including hygiene visits twice yearly. Erectile dysfunction-c/t Viagra prn. Advised yearly flu shot Achilles tendonitis - advised referral back to ortho.  He will consider Due back to GI/Dr. Hilarie Fredrickson for repeat colonoscopy Will set up for sleep study  Recommendations:  Eat a healthy low fat diet  Exercise most days per week, at least 30 minutes or more  See your eye doctor yearly for routine vision care.  See your dentist yearly for routine dental care including hygiene visits twice yearly.  I recommend you have a Shingles Vaccine to help prevent shingles or herpes zoster outbreak.   Please call your insurer to inquire  about coverage for the Shingrix vaccine given in 2 doses.   Some insurers cover this vaccine after age 26, some cover this after age 74.  If your insurer covers this, then call to schedule appointment to have this vaccine here.  I recommend a yearly Influenza/Flu vaccine, typically in September to help reduce the risk of you and others getting the flu illness  We will call with lab results  Call Dr. Vena Rua office as you may be due for repeat colonoscopy  Check with insurance coverage about having sleep study.   We will refer you for this.  Last year we recommended a brain MRI given abnormal testosterone labs.  This is still  recommended  See me yearly for a physical  F/u pending labs.   Kristian was seen today for annual exam.  Diagnoses and all orders for this visit:  Routine general medical examination at a health care facility -     POCT Urinalysis DIP (Proadvantage Device) -     Comprehensive metabolic panel -     Lipid panel -     Hemoglobin A1c -     CBC -     TSH  Erectile dysfunction, unspecified erectile dysfunction type  Hypogonadism in male  Hyperlipidemia, unspecified hyperlipidemia type  Obesity with serious comorbidity, unspecified classification, unspecified obesity type  S/P LASIK surgery  Screen for colon cancer -     Ambulatory referral to Gastroenterology  Chronic foot pain, right  Achilles tendon pain  Snoring  Witnessed apneic spells  Other orders -     sildenafil (VIAGRA) 100 MG tablet; Take 1 tablet (100 mg total) by mouth daily as needed. 1/2 tablet daily prn

## 2017-09-12 NOTE — Patient Instructions (Signed)
  Thank you for giving me the opportunity to serve you today.    Your diagnosis today includes: Encounter Diagnoses  Name Primary?  . Routine general medical examination at a health care facility Yes  . Erectile dysfunction, unspecified erectile dysfunction type   . Hypogonadism in male   . Hyperlipidemia, unspecified hyperlipidemia type   . Obesity with serious comorbidity, unspecified classification, unspecified obesity type   . S/P LASIK surgery   . Screen for colon cancer   . Chronic foot pain, right   . Achilles tendon pain   . Snoring   . Witnessed apneic spells     Recommendations:  Eat a healthy low fat diet  Exercise most days per week, at least 30 minutes or more  See your eye doctor yearly for routine vision care.  See your dentist yearly for routine dental care including hygiene visits twice yearly.  I recommend you have a Shingles Vaccine to help prevent shingles or herpes zoster outbreak.   Please call your insurer to inquire about coverage for the Shingrix vaccine given in 2 doses.   Some insurers cover this vaccine after age 59, some cover this after age 74.  If your insurer covers this, then call to schedule appointment to have this vaccine here.  I recommend a yearly Influenza/Flu vaccine, typically in September to help reduce the risk of you and others getting the flu illness  We will call with lab results  Call Dr. Vena Rua office as you may be due for repeat colonoscopy  Check with insurance coverage about having sleep study.   We will refer you for this.  Last year we recommended a brain MRI given abnormal testosterone labs.  This is still recommended  See me yearly for a physical

## 2017-09-12 NOTE — Addendum Note (Signed)
Addended by: Tyrone Apple on: 09/12/2017 11:27 AM   Modules accepted: Orders

## 2017-09-13 ENCOUNTER — Other Ambulatory Visit: Payer: Self-pay | Admitting: Medical

## 2017-09-13 LAB — LIPID PANEL
Cholesterol: 219 mg/dL — ABNORMAL HIGH (ref <200)
HDL: 53 mg/dL (ref >40)
LDL CHOLESTEROL (CALC): 131 mg/dL — AB
NON-HDL CHOLESTEROL (CALC): 166 mg/dL — AB (ref <130)
Total CHOL/HDL Ratio: 4.1 (calc) (ref <5.0)
Triglycerides: 210 mg/dL — ABNORMAL HIGH (ref <150)

## 2017-09-13 LAB — CBC
HCT: 46 % (ref 38.5–50.0)
Hemoglobin: 15 g/dL (ref 13.2–17.1)
MCH: 27.1 pg (ref 27.0–33.0)
MCHC: 32.6 g/dL (ref 32.0–36.0)
MCV: 83.2 fL (ref 80.0–100.0)
MPV: 9.3 fL (ref 7.5–12.5)
PLATELETS: 310 10*3/uL (ref 140–400)
RBC: 5.53 10*6/uL (ref 4.20–5.80)
RDW: 13 % (ref 11.0–15.0)
WBC: 4.9 10*3/uL (ref 3.8–10.8)

## 2017-09-13 LAB — COMPREHENSIVE METABOLIC PANEL
AG Ratio: 1.6 (calc) (ref 1.0–2.5)
ALBUMIN MSPROF: 4.2 g/dL (ref 3.6–5.1)
ALT: 16 U/L (ref 9–46)
AST: 18 U/L (ref 10–35)
Alkaline phosphatase (APISO): 73 U/L (ref 40–115)
BUN: 11 mg/dL (ref 7–25)
CHLORIDE: 104 mmol/L (ref 98–110)
CO2: 28 mmol/L (ref 20–32)
CREATININE: 1.12 mg/dL (ref 0.70–1.33)
Calcium: 9.5 mg/dL (ref 8.6–10.3)
GLOBULIN: 2.6 g/dL (ref 1.9–3.7)
GLUCOSE: 89 mg/dL (ref 65–99)
Potassium: 4.5 mmol/L (ref 3.5–5.3)
Sodium: 139 mmol/L (ref 135–146)
Total Bilirubin: 0.4 mg/dL (ref 0.2–1.2)
Total Protein: 6.8 g/dL (ref 6.1–8.1)

## 2017-09-13 LAB — HEMOGLOBIN A1C
HEMOGLOBIN A1C: 5.5 %{Hb} (ref <5.7)
MEAN PLASMA GLUCOSE: 111 (calc)
eAG (mmol/L): 6.2 (calc)

## 2017-09-13 LAB — TSH: TSH: 3.02 mIU/L (ref 0.40–4.50)

## 2017-09-13 MED ORDER — PRAVASTATIN SODIUM 20 MG PO TABS: 20.0000 mg | ORAL_TABLET | Freq: Every evening | ORAL | 0 refills | Status: DC

## 2017-10-15 ENCOUNTER — Encounter: Payer: Self-pay | Admitting: Medical

## 2017-10-16 ENCOUNTER — Encounter: Payer: Self-pay | Admitting: Medical

## 2017-10-16 ENCOUNTER — Telehealth: Payer: Self-pay | Admitting: Medical

## 2017-10-16 NOTE — Telephone Encounter (Signed)
Please call patient and inquire.  I received notification from Gastroenterology that he either no showed or wasn't able to be contacted for their appointment in which we referred them.   Please make sure they are in agreement for the referral, and make sure they are aware that when we refer to a specialist, no showing or not being able to be reached can result in the specialist not taking them as a patient, make cause them to incur no show fees, and is just not appropriate in general to [place] or them.  Please either have them call back to set up the appointment or document their reply.

## 2017-10-17 NOTE — Telephone Encounter (Signed)
The pt are aware of the appts , Gi calls all the patient to set up the appt. I explain this to patient before the referral are sent.

## 2017-10-17 NOTE — Telephone Encounter (Signed)
Some my question to you is do we need to just send him a letter stating that gastroenterology tried to contact him numerous times with no response?   I want him to be aware that we referred him, he did not respond to them, and that is not appropriate.  If you would rather [place] do this by letter, then we can start doing it that way.  Let me know what you think

## 2017-10-18 NOTE — Telephone Encounter (Signed)
I have sent letter to pt about this in past.

## 2017-10-19 ENCOUNTER — Encounter: Payer: Self-pay | Admitting: Medical

## 2018-01-03 ENCOUNTER — Other Ambulatory Visit: Payer: Self-pay | Admitting: Medical

## 2018-01-03 DIAGNOSIS — H1032 Unspecified acute conjunctivitis, left eye: Secondary | ICD-10-CM | POA: Diagnosis not present

## 2018-01-03 NOTE — Telephone Encounter (Signed)
Ok to refill 

## 2018-01-03 NOTE — Telephone Encounter (Signed)
Is this okay to refill? 

## 2018-06-16 DIAGNOSIS — M79661 Pain in right lower leg: Secondary | ICD-10-CM | POA: Diagnosis not present

## 2018-06-16 DIAGNOSIS — M5136 Other intervertebral disc degeneration, lumbar region: Secondary | ICD-10-CM | POA: Diagnosis not present

## 2018-06-18 DIAGNOSIS — M5441 Lumbago with sciatica, right side: Secondary | ICD-10-CM | POA: Diagnosis not present

## 2018-06-18 DIAGNOSIS — M5386 Other specified dorsopathies, lumbar region: Secondary | ICD-10-CM | POA: Diagnosis not present

## 2018-06-18 DIAGNOSIS — M5137 Other intervertebral disc degeneration, lumbosacral region: Secondary | ICD-10-CM | POA: Diagnosis not present

## 2018-06-19 ENCOUNTER — Encounter: Payer: Self-pay | Admitting: Medical

## 2018-06-19 ENCOUNTER — Ambulatory Visit: Payer: 59 | Admitting: Medical

## 2018-06-19 VITALS — BP 130/80 | HR 105 | Temp 98.0°F | Resp 16 | Ht 69.0 in | Wt 203.2 lb

## 2018-06-19 DIAGNOSIS — M5410 Radiculopathy, site unspecified: Secondary | ICD-10-CM

## 2018-06-19 DIAGNOSIS — M5386 Other specified dorsopathies, lumbar region: Secondary | ICD-10-CM | POA: Diagnosis not present

## 2018-06-19 DIAGNOSIS — M5440 Lumbago with sciatica, unspecified side: Secondary | ICD-10-CM | POA: Diagnosis not present

## 2018-06-19 DIAGNOSIS — M5137 Other intervertebral disc degeneration, lumbosacral region: Secondary | ICD-10-CM | POA: Diagnosis not present

## 2018-06-19 DIAGNOSIS — M62838 Other muscle spasm: Secondary | ICD-10-CM | POA: Diagnosis not present

## 2018-06-19 DIAGNOSIS — M5441 Lumbago with sciatica, right side: Secondary | ICD-10-CM | POA: Diagnosis not present

## 2018-06-19 MED ORDER — TRAMADOL HCL 50 MG PO TABS: 50.0000 mg | ORAL_TABLET | Freq: Four times a day (QID) | ORAL | 0 refills | Status: DC | PRN

## 2018-06-19 MED ORDER — CYCLOBENZAPRINE HCL 10 MG PO TABS: 10.0000 mg | ORAL_TABLET | Freq: Every day | ORAL | 0 refills | Status: DC

## 2018-06-19 NOTE — Patient Instructions (Signed)
Recommendations:  Finish the Prednisone given by the other clinic, but don't take the Meloxicam until finished with prednisone  Begin Flexeril muscle relaxer when you are going to be home for 3-4 hours.  This will help ease spasm but can cause drowsiness.    You can use Ultram for pain as needed for worse pain up to every 8 hours. Caution this can cause drowsiness  Continue with chiropractor  If not much improved within 3 weeks, recheck.

## 2018-06-19 NOTE — Progress Notes (Signed)
Subjective: Chief Complaint  Patient presents with  . leg pain    sciatic pain down right leg X 1 week   Here for complaint of right sided low back pain with pain radiating down the leg for the last week.  He went to urgent care about a week ago for symptoms, was prescribed prednisone and Meloxicam.  So far those medicines have not helped.  He ended up going to chiropractor yesterday who plans to see him 3 times a week for the next 2 weeks.  He denies ongoing pains in recent weeks as this is acute.  No recent trauma or injury.  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  He feels some tingling in the right leg, but the pain is worse symptom  No incontinence no saddle anesthesia no fever no abdominal pain.  He had x-rays at both urgent care and chiropractor and they mention a possible pinched nerve  Past Medical History:  Diagnosis Date  . Dyslipidemia   . ED (erectile dysfunction) 2008  . History of EKG 1/15  . Hyperlipidemia   . Keloid of skin   . Status post LASIK surgery    Current Outpatient Medications on File Prior to Visit  Medication Sig Dispense Refill  . meloxicam (MOBIC) 15 MG tablet     . predniSONE (DELTASONE) 20 MG tablet     . pravastatin (PRAVACHOL) 20 MG tablet Take 1 tablet (20 mg total) by mouth every evening. (Patient not taking: Reported on 06/19/2018) 90 tablet 0  . sildenafil (VIAGRA) 100 MG tablet Take 1 tablet (100 mg total) by mouth daily as needed. 1/2 tablet daily prn (Patient not taking: Reported on 06/19/2018) 10 tablet 11  . sildenafil (VIAGRA) 100 MG tablet TAKE 1/2 TO 1 TABLET BY MOUTH DAILY AS NEEDED (Patient not taking: Reported on 06/19/2018) 10 tablet 0   No current facility-administered medications on file prior to visit.    ROS as in subjective    Objective: BP 130/80   Pulse (!) 105   Temp 98 F (36.7 C) (Oral)   Resp 16   Ht 5\' 9"  (1.753 m)   Wt 203 lb 3.2 oz (92.2 kg)   SpO2 97%   BMI 30.01 kg/m     General appearance: alert, no distress, WD/WN,  Abdomen: +bs, soft, non tender, non distended, no masses, no hepatomegaly, no splenomegaly Back: +spasm right lumbar paraspinal region, tender in same area, but ROM full with mild pain.   , no scoliosis  Legs nontender, normal ROM, no swelling or deformity He had trouble with toe walk on the right, otherwise negative straight leg raise, normal strength and sensation and DTRs, Pulses: 2+ symmetric, upper and lower extremities, normal cap refill      Assessment: Encounter Diagnoses  Name Primary?  . Radicular leg pain Yes  . Acute right-sided low back pain with sciatica, sciatica laterality unspecified   . Muscle spasm      Plan: Advised relative rest, can continue cold compress, stretching, advise rest for the next 2 days before doing chiropractic therapy.  Finish out the prednisone but advised not to take the Meloxicam until he has finished the prednisone.  Can use Ultram for breakthrough pain as needed, Flexeril the next few nights, cautioned on sedation.  Advised not completely resolved within 2 to 3 weeks and recheck.  He will continue with plan for chiropractic therapy  Blas was seen today for leg pain.  Diagnoses and  all orders for this visit:  Radicular leg pain  Acute right-sided low back pain with sciatica, sciatica laterality unspecified  Muscle spasm  Other orders -     cyclobenzaprine (FLEXERIL) 10 MG tablet; Take 1 tablet (10 mg total) by mouth at bedtime. Prn for spasm -     traMADol (ULTRAM) 50 MG tablet; Take 1 tablet (50 mg total) by mouth every 6 (six) hours as needed.   F/u 2-3 weeks, sooner prn

## 2018-06-20 DIAGNOSIS — M5441 Lumbago with sciatica, right side: Secondary | ICD-10-CM | POA: Diagnosis not present

## 2018-06-20 DIAGNOSIS — M5386 Other specified dorsopathies, lumbar region: Secondary | ICD-10-CM | POA: Diagnosis not present

## 2018-06-20 DIAGNOSIS — M5137 Other intervertebral disc degeneration, lumbosacral region: Secondary | ICD-10-CM | POA: Diagnosis not present

## 2018-06-24 DIAGNOSIS — M5441 Lumbago with sciatica, right side: Secondary | ICD-10-CM | POA: Diagnosis not present

## 2018-06-24 DIAGNOSIS — M5137 Other intervertebral disc degeneration, lumbosacral region: Secondary | ICD-10-CM | POA: Diagnosis not present

## 2018-06-24 DIAGNOSIS — M5386 Other specified dorsopathies, lumbar region: Secondary | ICD-10-CM | POA: Diagnosis not present

## 2018-06-26 DIAGNOSIS — M5137 Other intervertebral disc degeneration, lumbosacral region: Secondary | ICD-10-CM | POA: Diagnosis not present

## 2018-06-26 DIAGNOSIS — M5441 Lumbago with sciatica, right side: Secondary | ICD-10-CM | POA: Diagnosis not present

## 2018-06-26 DIAGNOSIS — M5386 Other specified dorsopathies, lumbar region: Secondary | ICD-10-CM | POA: Diagnosis not present

## 2018-06-28 DIAGNOSIS — M5441 Lumbago with sciatica, right side: Secondary | ICD-10-CM | POA: Diagnosis not present

## 2018-06-28 DIAGNOSIS — M5137 Other intervertebral disc degeneration, lumbosacral region: Secondary | ICD-10-CM | POA: Diagnosis not present

## 2018-06-28 DIAGNOSIS — M5386 Other specified dorsopathies, lumbar region: Secondary | ICD-10-CM | POA: Diagnosis not present

## 2018-07-03 DIAGNOSIS — M5441 Lumbago with sciatica, right side: Secondary | ICD-10-CM | POA: Diagnosis not present

## 2018-07-03 DIAGNOSIS — M5137 Other intervertebral disc degeneration, lumbosacral region: Secondary | ICD-10-CM | POA: Diagnosis not present

## 2018-07-03 DIAGNOSIS — M5386 Other specified dorsopathies, lumbar region: Secondary | ICD-10-CM | POA: Diagnosis not present

## 2018-10-16 ENCOUNTER — Encounter: Payer: Self-pay | Admitting: Medical

## 2018-10-16 ENCOUNTER — Ambulatory Visit: Payer: 59 | Admitting: Medical

## 2018-10-16 VITALS — BP 120/74 | HR 76 | Temp 97.7°F | Resp 16 | Ht 69.0 in | Wt 198.0 lb

## 2018-10-16 DIAGNOSIS — M25561 Pain in right knee: Secondary | ICD-10-CM

## 2018-10-16 DIAGNOSIS — N529 Male erectile dysfunction, unspecified: Secondary | ICD-10-CM

## 2018-10-16 DIAGNOSIS — E785 Hyperlipidemia, unspecified: Secondary | ICD-10-CM

## 2018-10-16 DIAGNOSIS — M7660 Achilles tendinitis, unspecified leg: Secondary | ICD-10-CM

## 2018-10-16 DIAGNOSIS — Z9889 Other specified postprocedural states: Secondary | ICD-10-CM | POA: Diagnosis not present

## 2018-10-16 DIAGNOSIS — Z0000 Encounter for general adult medical examination without abnormal findings: Secondary | ICD-10-CM | POA: Diagnosis not present

## 2018-10-16 DIAGNOSIS — E291 Testicular hypofunction: Secondary | ICD-10-CM

## 2018-10-16 DIAGNOSIS — R0683 Snoring: Secondary | ICD-10-CM

## 2018-10-16 DIAGNOSIS — Z1211 Encounter for screening for malignant neoplasm of colon: Secondary | ICD-10-CM

## 2018-10-16 DIAGNOSIS — Z125 Encounter for screening for malignant neoplasm of prostate: Secondary | ICD-10-CM

## 2018-10-16 DIAGNOSIS — Z1159 Encounter for screening for other viral diseases: Secondary | ICD-10-CM

## 2018-10-16 DIAGNOSIS — M79671 Pain in right foot: Secondary | ICD-10-CM

## 2018-10-16 DIAGNOSIS — Z1331 Encounter for screening for depression: Secondary | ICD-10-CM | POA: Insufficient documentation

## 2018-10-16 DIAGNOSIS — G8929 Other chronic pain: Secondary | ICD-10-CM

## 2018-10-16 DIAGNOSIS — Z2821 Immunization not carried out because of patient refusal: Secondary | ICD-10-CM

## 2018-10-16 DIAGNOSIS — R0989 Other specified symptoms and signs involving the circulatory and respiratory systems: Secondary | ICD-10-CM

## 2018-10-16 DIAGNOSIS — D369 Benign neoplasm, unspecified site: Secondary | ICD-10-CM

## 2018-10-16 LAB — POCT URINALYSIS DIP (PROADVANTAGE DEVICE)
BILIRUBIN UA: NEGATIVE
BILIRUBIN UA: NEGATIVE mg/dL
Blood, UA: NEGATIVE
GLUCOSE UA: NEGATIVE mg/dL
Leukocytes, UA: NEGATIVE
Nitrite, UA: NEGATIVE
Protein Ur, POC: NEGATIVE mg/dL
SPECIFIC GRAVITY, URINE: 1.015
UUROB: NEGATIVE
pH, UA: 6 (ref 5.0–8.0)

## 2018-10-16 MED ORDER — SILDENAFIL CITRATE 100 MG PO TABS: 100.0000 mg | ORAL_TABLET | Freq: Every day | ORAL | 11 refills | Status: DC | PRN

## 2018-10-16 NOTE — Progress Notes (Addendum)
Subjective: Chief Complaint  Patient presents with  . CPE    fasting CPE    Here for physical  Medical team: Dr. Zenovia Jarred, GI Dentist No current eye doctor , Camelia Eng, PA-C here for primary care  Concerns: He notes since last visit when he was having foot pain, the pain improved, but foot still feels numb in ball of right foot.    Having some posterior right knee pain, particular if bending down.     Had hearing test at work, had some abnormality.  Works ITG, is around some Musician.  Not around as much loud machinery now but has worn hearing protection prior.   hyperlipidemia - hasn't been taking statin  ED - still uses Viagra, works fine.   Uses Viagra periodically  Recently moved into a new house, trying to sell the other house.  Has a rental property.    Sleep, snoring issues prior - never went for sleep study  Reviewed their medical, surgical, family, social, medication, and allergy history and updated chart as appropriate.  Past Medical History:  Diagnosis Date  . Dyslipidemia   . ED (erectile dysfunction) 2008  . Hyperlipidemia   . Keloid of skin   . Status post LASIK surgery     Past Surgical History:  Procedure Laterality Date  . COLONOSCOPY  11/2013   tubular adenoma, Dr. Zenovia Jarred  . LASIK Bilateral 2008    Social History   Socioeconomic History  . Marital status: Married    Spouse name: Not on file  . Number of children: Not on file  . Years of education: Not on file  . Highest education level: Not on file  Occupational History  . Occupation: maintenance    Employer: [employer]  . Financial resource strain: Not on file  . Food insecurity:    Worry: Not on file    Inability: Not on file  . Transportation needs:    Medical: Not on file    Non-medical: Not on file  Tobacco Use  . Smoking status: Never Smoker  . Smokeless tobacco: Never Used  Substance and Sexual Activity  . Alcohol use: No     Alcohol/week: 0.0 standard drinks    Comment: occasional on a holiday  . Drug use: No  . Sexual activity: Not on file  Lifestyle  . Physical activity:    Days per week: Not on file    Minutes per session: Not on file  . Stress: Not on file  Relationships  . Social connections:    Talks on phone: Not on file    Gets together: Not on file    Attends religious service: Not on file    Active member of club or organization: Not on file    Attends meetings of clubs or organizations: Not on file    Relationship status: Not on file  . Intimate partner violence:    Fear of current or ex partner: Not on file    Emotionally abused: Not on file    Physically abused: Not on file    Forced sexual activity: Not on file  Other Topics Concern  . Not on file  Social History Narrative   Married, 2 children, Exercise -frequently, treadmill, weights.  Works at [employer], [employer].   09/2018    Family History  Problem Relation Age of Onset  . Diabetes Mother   . Hypertension Father   . Thyroid disease Sister   . Arthritis Brother   .  Stroke Neg Hx   . Heart disease Neg Hx   . Hyperlipidemia Neg Hx   . Cancer Neg Hx   . Colon cancer Neg Hx   . Rectal cancer Neg Hx   . Stomach cancer Neg Hx      Current Outpatient Medications:  .  sildenafil (VIAGRA) 100 MG tablet, Take 1 tablet (100 mg total) by mouth daily as needed. 1/2 tablet daily prn, Disp: 10 tablet, Rfl: 11 .  cyclobenzaprine (FLEXERIL) 10 MG tablet, Take 1 tablet (10 mg total) by mouth at bedtime. Prn for spasm (Patient not taking: Reported on 10/16/2018), Disp: 10 tablet, Rfl: 0 .  meloxicam (MOBIC) 15 MG tablet, , Disp: , Rfl:  .  pravastatin (PRAVACHOL) 20 MG tablet, Take 1 tablet (20 mg total) by mouth every evening. (Patient not taking: Reported on 06/19/2018), Disp: 90 tablet, Rfl: 0 .  predniSONE (DELTASONE) 20 MG tablet, , Disp: , Rfl:  .  traMADol (ULTRAM) 50 MG tablet, Take 1 tablet (50 mg total) by mouth every 6 (six) hours  as needed. (Patient not taking: Reported on 10/16/2018), Disp: 10 tablet, Rfl: 0  No Known Allergies    Review of Systems Constitutional: -fever, -chills, -sweats, -unexpected weight change, -decreased appetite, -fatigue Allergy: -sneezing, -itching, -congestion Dermatology: -changing moles, --rash, -lumps ENT: -runny nose, -ear pain, -sore throat, -hoarseness, -sinus pain, -teeth pain, - ringing in ears, -hearing loss, -nosebleeds Cardiology: -chest pain, -palpitations, -swelling, -difficulty breathing when lying flat, -waking up short of breath Respiratory: -cough, -shortness of breath, -difficulty breathing with exercise or exertion, -wheezing, -coughing up blood Gastroenterology: -abdominal pain, -nausea, -vomiting, -diarrhea, -constipation, -blood in stool, -changes in bowel movement, -difficulty swallowing or eating Hematology: -bleeding, -bruising  Musculoskeletal: +joint aches, -muscle aches, -joint swelling, -back pain, -neck pain, -cramping, -changes in gait Ophthalmology: denies vision changes, eye redness, itching, discharge Urology: -burning with urination, -difficulty urinating, -blood in urine, -urinary frequency, -urgency, -incontinence Neurology: -headache, -weakness, -tingling, -numbness, -memory loss, -falls, -dizziness Psychology: -depressed mood, -agitation, -sleep problems Male GU: no testicular mass, pain, no lymph nodes swollen, no swelling, no rash.     Objective:  BP 120/74   Pulse 76   Temp 97.7 F (36.5 C) (Oral)   Resp 16   Ht 5\' 9"  (1.753 m)   Wt 198 lb (89.8 kg)   SpO2 100%   BMI 29.24 kg/m   General appearance: alert, no distress, WD/WN, African American male Skin: right upper thigh with 3cm diameter area of darkened skin or bruising, faint, otherwise skin unremarkable HEENT: normocephalic, conjunctiva/corneas normal, sclerae anicteric, PERRLA, EOMi, nares patent, no discharge or erythema, pharynx normal Oral cavity: MMM, tongue normal, teeth in  good repair Neck: supple, no lymphadenopathy, no thyromegaly, no masses, normal ROM, no bruits Chest: non tender, normal shape and expansion Heart: RRR, normal S1, S2, no murmurs Lungs: CTA bilaterally, no wheezes, rhonchi, or rales Abdomen: +bs, soft, non tender, non distended, no masses, no hepatomegaly, no splenomegaly, no bruits Back: non tender, normal ROM, no scoliosis Musculoskeletal: right upper lateral thigh with linear 6cm indention chronic, Right knee nontender, no laxity, there is a fullness in the posterior fossa suggestive of possible Baker's cyst, there is also a little bit of a click felt with McMurray test.  Right Achilles tendon with raised up nodule in the distal tendon, mildly tender chronically.  He is tender over the right great toe through third toe MTPs, tender over the volar right foot in general, otherwise musculoskeletal exam upper and lower  extremities unremarkable upper extremities non tender, no obvious deformity, normal ROM throughout, lower extremities non tender, no obvious deformity, normal ROM throughout Extremities: no edema, no cyanosis, no clubbing Pulses: 2+ symmetric, upper and 1+ lower extremities, normal cap refill Neurological: alert, oriented x 3, CN2-12 intact, strength normal upper extremities and lower extremities, sensation normal throughout, DTRs 2+ throughout, no cerebellar signs, gait normal Psychiatric: normal affect, behavior normal, pleasant  GU: normal male external genitalia,circumcised, nontender, no masses, no hernia, no lymphadenopathy Rectal: anus normal tone, prostate WNL   Assessment and Plan :   Encounter Diagnoses  Name Primary?  . Routine general medical examination at a health care facility Yes  . Erectile dysfunction, unspecified erectile dysfunction type   . Hyperlipidemia, unspecified hyperlipidemia type   . S/P LASIK surgery   . Chronic foot pain, right   . Hypogonadism in male   . Snoring   . Tubular adenoma   .  Screen for colon cancer   . Screening for prostate cancer   . Influenza vaccination declined   . Positive depression screening   . Chronic pain of right knee   . Achilles tendon pain   . Encounter for hepatitis C screening test for low risk patient   . Decreased pulse     Physical exam - discussed and counseled on healthy lifestyle, diet, exercise, preventative care, vaccinations, sick and well care, proper use of emergency dept and after hours care, and addressed their concerns.    Health screening: See your eye doctor yearly for routine vision care. See your dentist yearly for routine dental care including hygiene visits twice yearly.  Cancer screening  Colonoscopy:  Referral back to GI  Discussed PSA, prostate exam, and prostate cancer screening risks/benefits.     Vaccinations: Advised yearly influenza vaccine Patient declines influenza vaccine   Separate significant issues discussed: ED - c/t viagra prn  hyperlipidemia - noncompliant with medications  Decreased pulses - consider ABIs  Chronic knee pain, Achilles tendon pain, foot pain  At this point I recommend a referral to orthopedics as they can do x-rays in office and you have multiple issues that could be addressed and get expert opinion  Snoring, concern for sleep apnea- the me know if you want a referral for sleep study.  1 of the main treatments for sleep apnea if you have this would be weight loss, so I recommend you continue efforts to lose weight through healthy diet and exercise  You are due back to see Dr. Hilarie Fredrickson for updated colonoscopy.  Please call them at [phone]  After reviewing PHQ9, he doesn't seem to endorse any mood issue  We will will call with lab results and recommendations   Juan Fleming was seen today for cpe.  Diagnoses and all orders for this visit:  Routine general medical examination at a health care facility -     POCT Urinalysis DIP (Proadvantage Device) -     Comprehensive  metabolic panel -     CBC with Differential/Platelet -     PSA -     Lipid panel  Erectile dysfunction, unspecified erectile dysfunction type  Hyperlipidemia, unspecified hyperlipidemia type  S/P LASIK surgery  Chronic foot pain, right -     Ambulatory referral to Orthopedic Surgery  Hypogonadism in male  Snoring  Tubular adenoma -     Ambulatory referral to Gastroenterology  Screen for colon cancer -     Ambulatory referral to Gastroenterology  Screening for prostate cancer -  PSA  Influenza vaccination declined  Positive depression screening  Chronic pain of right knee -     Ambulatory referral to Orthopedic Surgery  Achilles tendon pain -     Ambulatory referral to Orthopedic Surgery  Encounter for hepatitis C screening test for low risk patient -     Hepatitis C antibody  Decreased pulse -     Ambulatory referral to Orthopedic Surgery  Other orders -     sildenafil (VIAGRA) 100 MG tablet; Take 1 tablet (100 mg total) by mouth daily as needed. 1/2 tablet daily prn   Follow-up pending labs, yearly for physical

## 2018-10-16 NOTE — Patient Instructions (Signed)
Chronic knee pain, Achilles tendon pain, foot pain  At this point I recommend a referral to orthopedics as they can do x-rays in office and you have multiple issues that could be addressed and get expert opinion  Snoring, concern for sleep apnea- the me know if you want a referral for sleep study.  1 of the main treatments for sleep apnea if you have this would be weight loss, so I recommend you continue efforts to lose weight through healthy diet and exercise  You are due back to see Dr. Hilarie Fredrickson for updated colonoscopy.  Please call them at [phone]  We will will call with lab results and recommendations   Thanks for trusting [place] with your health care and for coming in for a physical today.  Below are some general recommendations I have for you:  Yearly screenings See your eye doctor yearly for routine vision care. See your dentist yearly for routine dental care including hygiene visits twice yearly. See me here yearly for a routine physical and preventative care visit      Preventative Care for Adults - Male      [place]:  A routine yearly physical is a good way to check in with your primary care provider about your health and preventive screening. It is also an opportunity to share updates about your health and any concerns you have, and receive a thorough all-over exam.   Most health insurance companies pay for at least some preventative services.  Check with your health plan for specific coverages.  WHAT PREVENTATIVE SERVICES DO MEN NEED?  Adult men should have their weight and blood pressure checked regularly.   Men age 53 and older should have their cholesterol levels checked regularly.  Beginning at age 89 and continuing to age 80, men should be screened for colorectal cancer.  Certain people may need continued testing until age 80.  Updating vaccinations is part of preventative care.  Vaccinations help protect against diseases such as the  flu.  Osteoporosis is a disease in which the bones lose minerals and strength as we age. Men ages 39 and over should discuss this with their caregivers  Lab tests are generally done as part of preventative care to screen for anemia and blood disorders, to screen for problems with the kidneys and liver, to screen for bladder problems, to check blood sugar, and to check your cholesterol level.  Preventative services generally include counseling about diet, exercise, avoiding tobacco, drugs, excessive alcohol consumption, and sexually transmitted infections.    GENERAL RECOMMENDATIONS FOR GOOD HEALTH:  Healthy diet:  Eat a variety of foods, including fruit, vegetables, animal or vegetable protein, such as meat, fish, chicken, and eggs, or beans, lentils, tofu, and grains, such as rice.  Drink plenty of water daily.  Decrease saturated fat in the diet, avoid lots of red meat, processed foods, sweets, fast foods, and fried foods.  Exercise:  Aerobic exercise helps maintain good heart health. At least 30-40 minutes of moderate-intensity exercise is recommended. For example, a brisk walk that increases your heart rate and breathing. This should be done on most days of the week.   Find a type of exercise or a variety of exercises that you enjoy so that it becomes a part of your daily life.  Examples are running, walking, swimming, water aerobics, and biking.  For motivation and support, explore group exercise such as aerobic class, spin class, Zumba, Yoga,or  martial arts, etc.    Set exercise  goals for yourself, such as a certain weight goal, walk or run in a race such as a 5k walk/run.  Speak to your primary care provider about exercise goals.  Disease prevention:  If you smoke or chew tobacco, find out from your caregiver how to quit. It can literally save your life, no matter how long you have been a tobacco user. If you do not use tobacco, never begin.   Maintain a healthy diet and normal  weight. Increased weight leads to problems with blood pressure and diabetes.   The Body Mass Index or BMI is a way of measuring how much of your body is fat. Having a BMI above 27 increases the risk of heart disease, diabetes, hypertension, stroke and other problems related to obesity. Your caregiver can help determine your BMI and based on it develop an exercise and dietary program to help you achieve or maintain this important measurement at a healthful level.  High blood pressure causes heart and blood vessel problems.  Persistent high blood pressure should be treated with medicine if weight loss and exercise do not work.   Fat and cholesterol leaves deposits in your arteries that can block them. This causes heart disease and vessel disease elsewhere in your body.  If your cholesterol is found to be high, or if you have heart disease or certain other medical conditions, then you may need to have your cholesterol monitored frequently and be treated with medication.   Ask if you should have a cardiac stress test if your history suggests this. A stress test is a test done on a treadmill that looks for heart disease. This test can find disease prior to there being a problem.  Osteoporosis is a disease in which the bones lose minerals and strength as we age. This can result in serious bone fractures. Risk of osteoporosis can be identified using a bone density scan. Men ages 6 and over should discuss this with their caregivers. Ask your caregiver whether you should be taking a calcium supplement and Vitamin D, to reduce the rate of osteoporosis.   Avoid drinking alcohol in excess (more than two drinks per day).  Avoid use of street drugs. Do not share needles with anyone. Ask for professional help if you need assistance or instructions on stopping the use of alcohol, cigarettes, and/or drugs.  Brush your teeth twice a day with fluoride toothpaste, and floss once a day. Good oral hygiene prevents tooth  decay and gum disease. The problems can be painful, unattractive, and can cause other health problems. Visit your dentist for a routine oral and dental check up and preventive care every 6-12 months.   Look at your skin regularly.  Use a mirror to look at your back. Notify your caregivers of changes in moles, especially if there are changes in shapes, colors, a size larger than a pencil eraser, an irregular border, or development of new moles.  Safety:  Use seatbelts 100% of the time, whether driving or as a passenger.  Use safety devices such as hearing protection if you work in environments with loud noise or significant background noise.  Use safety glasses when doing any work that could send debris in to the eyes.  Use a helmet if you ride a bike or motorcycle.  Use appropriate safety gear for contact sports.  Talk to your caregiver about gun safety.  Use sunscreen with a SPF (or skin protection factor) of 15 or greater.  Lighter skinned people are at a  greater risk of skin cancer. Don't forget to also wear sunglasses in order to protect your eyes from too much damaging sunlight. Damaging sunlight can accelerate cataract formation.   Practice safe sex. Use condoms. Condoms are used for birth control and to help reduce the spread of sexually transmitted infections (or STIs).  Some of the STIs are gonorrhea (the clap), chlamydia, syphilis, trichomonas, herpes, HPV (human papilloma virus) and HIV (human immunodeficiency virus) which causes AIDS. The herpes, HIV and HPV are viral illnesses that have no cure. These can result in disability, cancer and death.   Keep carbon monoxide and smoke detectors in your home functioning at all times. Change the batteries every 6 months or use a model that plugs into the wall.   Vaccinations:  Stay up to date with your tetanus shots and other required immunizations. You should have a booster for tetanus every 10 years. Be sure to get your flu shot every year,  since 5%-20% of the [place] population comes down with the flu. The flu vaccine changes each year, so being vaccinated once is not enough. Get your shot in the fall, before the flu season peaks.   Other vaccines to consider:  Human Papilloma Virus or HPV causes cancer of the cervix, and other infections that can be transmitted from person to person. There is a vaccine for HPV, and males should get immunized between the ages of 47 and 7. It requires a series of 3 shots.   Pneumococcal vaccine to protect against certain types of pneumonia.  This is normally recommended for adults age 59 or older.  However, adults younger than 58 years old with certain underlying conditions such as diabetes, heart or lung disease should also receive the vaccine.  Shingles vaccine to protect against Varicella Zoster if you are older than age 33, or younger than 58 years old with certain underlying illness.  If you have not had the Shingrix vaccine, please call your insurer to inquire about coverage for the Shingrix vaccine given in 2 doses.   Some insurers cover this vaccine after age 52, some cover this after age 48.  If your insurer covers this, then call to schedule appointment to have this vaccine here  Hepatitis A vaccine to protect against a form of infection of the liver by a virus acquired from food.  Hepatitis B vaccine to protect against a form of infection of the liver by a virus acquired from blood or body fluids, particularly if you work in health care.  If you plan to travel internationally, check with your local health department for specific vaccination recommendations.   What should I know about cancer screening? Many types of cancers can be detected early and may often be prevented. Lung Cancer  You should be screened every year for lung cancer if: ? You are a current smoker who has smoked for at least 30 years. ? You are a former smoker who has quit within the past 15 years.  Talk to your health  care provider about your screening options, when you should start screening, and how often you should be screened.  Colorectal Cancer  Routine colorectal cancer screening usually begins at 58 years of age and should be repeated every 5-10 years until you are 58 years old. You may need to be screened more often if early forms of precancerous polyps or small growths are found. Your health care provider may recommend screening at an earlier age if you have risk factors for colon cancer.  Your health care provider may recommend using home test kits to check for hidden blood in the stool.  A small camera at the end of a tube can be used to examine your colon (sigmoidoscopy or colonoscopy). This checks for the earliest forms of colorectal cancer.  Prostate and Testicular Cancer  Depending on your age and overall health, your health care provider may do certain tests to screen for prostate and testicular cancer.  Talk to your health care provider about any symptoms or concerns you have about testicular or prostate cancer.  Skin Cancer  Check your skin from head to toe regularly.  Tell your health care provider about any new moles or changes in moles, especially if: ? There is a change in a mole's size, shape, or color. ? You have a mole that is larger than a pencil eraser.  Always use sunscreen. Apply sunscreen liberally and repeat throughout the day.  Protect yourself by wearing long sleeves, pants, a wide-brimmed hat, and sunglasses when outside.

## 2018-10-16 NOTE — Addendum Note (Signed)
Addended by: Carlena Hurl on: 10/16/2018 02:04 PM   Modules accepted: Orders

## 2018-10-16 NOTE — Addendum Note (Signed)
Addended by: Carlena Hurl on: 10/16/2018 10:31 AM   Modules accepted: Orders

## 2018-10-17 ENCOUNTER — Other Ambulatory Visit: Payer: Self-pay | Admitting: Medical

## 2018-10-17 LAB — CBC WITH DIFFERENTIAL/PLATELET
Basophils Absolute: 0 10*3/uL (ref 0.0–0.2)
Basos: 1 %
EOS (ABSOLUTE): 0.1 10*3/uL (ref 0.0–0.4)
Eos: 2 %
HEMOGLOBIN: 15.1 g/dL (ref 13.0–17.7)
Hematocrit: 45.9 % (ref 37.5–51.0)
Immature Grans (Abs): 0 10*3/uL (ref 0.0–0.1)
Immature Granulocytes: 0 %
Lymphocytes Absolute: 1.9 10*3/uL (ref 0.7–3.1)
Lymphs: 36 %
MCH: 27.5 pg (ref 26.6–33.0)
MCHC: 32.9 g/dL (ref 31.5–35.7)
MCV: 84 fL (ref 79–97)
MONOCYTES: 11 %
Monocytes Absolute: 0.6 10*3/uL (ref 0.1–0.9)
Neutrophils Absolute: 2.6 10*3/uL (ref 1.4–7.0)
Neutrophils: 50 %
Platelets: 325 10*3/uL (ref 150–450)
RBC: 5.5 x10E6/uL (ref 4.14–5.80)
RDW: 13 % (ref 11.6–15.4)
WBC: 5.2 10*3/uL (ref 3.4–10.8)

## 2018-10-17 LAB — LIPID PANEL
Chol/HDL Ratio: 4.3 ratio (ref 0.0–5.0)
Cholesterol, Total: 217 mg/dL — ABNORMAL HIGH (ref 100–199)
HDL: 51 mg/dL (ref >39)
LDL Calculated: 137 mg/dL — ABNORMAL HIGH (ref 0–99)
Triglycerides: 146 mg/dL (ref 0–149)
VLDL CHOLESTEROL CAL: 29 mg/dL (ref 5–40)

## 2018-10-17 LAB — COMPREHENSIVE METABOLIC PANEL
A/G RATIO: 1.8 (ref 1.2–2.2)
ALBUMIN: 4.4 g/dL (ref 3.8–4.9)
ALT: 20 IU/L (ref 0–44)
AST: 18 IU/L (ref 0–40)
Alkaline Phosphatase: 85 IU/L (ref 39–117)
BUN / CREAT RATIO: 11 (ref 9–20)
BUN: 13 mg/dL (ref 6–24)
Bilirubin Total: 0.4 mg/dL (ref 0.0–1.2)
CALCIUM: 10.1 mg/dL (ref 8.7–10.2)
CO2: 24 mmol/L (ref 20–29)
Chloride: 101 mmol/L (ref 96–106)
Creatinine, Ser: 1.21 mg/dL (ref 0.76–1.27)
GFR, EST AFRICAN AMERICAN: 76 mL/min/{1.73_m2} (ref >59)
GFR, EST NON AFRICAN AMERICAN: 66 mL/min/{1.73_m2} (ref >59)
GLOBULIN, TOTAL: 2.5 g/dL (ref 1.5–4.5)
Glucose: 91 mg/dL (ref 65–99)
POTASSIUM: 4.7 mmol/L (ref 3.5–5.2)
SODIUM: 138 mmol/L (ref 134–144)
TOTAL PROTEIN: 6.9 g/dL (ref 6.0–8.5)

## 2018-10-17 LAB — HEPATITIS C ANTIBODY: Hep C Virus Ab: <0.1 s/co ratio (ref 0.0–0.9)

## 2018-10-17 LAB — PSA: PROSTATE SPECIFIC AG, SERUM: 1.4 ng/mL (ref 0.0–4.0)

## 2018-10-18 ENCOUNTER — Ambulatory Visit (INDEPENDENT_AMBULATORY_CARE_PROVIDER_SITE_OTHER): Payer: 59 | Admitting: Family Medicine

## 2018-10-18 ENCOUNTER — Encounter (INDEPENDENT_AMBULATORY_CARE_PROVIDER_SITE_OTHER): Payer: Self-pay | Admitting: Family Medicine

## 2018-10-18 DIAGNOSIS — R200 Anesthesia of skin: Secondary | ICD-10-CM | POA: Diagnosis not present

## 2018-10-18 DIAGNOSIS — R202 Paresthesia of skin: Secondary | ICD-10-CM

## 2018-10-18 MED ORDER — ETODOLAC 400 MG PO TABS: 400.0000 mg | ORAL_TABLET | Freq: Two times a day (BID) | ORAL | 3 refills | Status: DC | PRN

## 2018-10-18 NOTE — Patient Instructions (Signed)
     Glucosamine Sulfate:  1,000 mg twice daily    

## 2018-10-18 NOTE — Progress Notes (Signed)
Office Visit Note   Patient: Juan Fleming           Date of Birth: 01/12/1961           MRN: [id number] Visit Date: 10/18/2018 Requested by: Carlena Hurl, PA-C [address] [place], [place] [address] PCP: Carlena Hurl, PA-C  Subjective: Chief Complaint  Patient presents with  . Right Knee - Pain    Knee is grinding x 3 months. Popping.   . Right Foot - Pain    Numbness and tingling bottom of foot. Throbbing pain right lower leg (lateral).  X 2 months.    HPI: He is a 58 year old with right leg pain and numbness.  Symptoms started several months ago, no definite injury.  He went to a chiropractor and had temporary relief of symptoms.  He has had a little bit of low back pain but not severe.  He has not had any weakness in his leg but he feels a frequent twitching in the muscle on the lateral aspect of his calf.  He has constant numbness on the ball of his foot and it is hypersensitive to touch.  He also notices some grinding and popping in his knee but it does not hurt.  He has not taken medication for his pain.  Denies any bowel or bladder dysfunction.              ROS: All other systems were reviewed and are negative as pertains to the chief complaint.  Objective: Vital Signs: There were no vitals taken for this visit.  Physical Exam:  Right leg: Low back has good range of motion, negative stork test, negative Spurling's test.  Lower extremity strength and reflexes are normal.  Hypersensitive to light touch on the ball of his right foot.  Negative Tinel's at the common peroneal nerve.  2+ patellofemoral crepitus but no pain to palpation around the knee.  Imaging: None today.  He had x-rays in his chiropractor's office but those are not  available for review.  Assessment & Plan: 1.  Right leg pain and numbness, suspicious for lumbar radiculopathy. -Trial of physical therapy, anti-inflammatory as needed.  If symptoms are not improving in a couple weeks then he will  call me for a lumbar MRI scan.  Could contemplate epidural injection if source of pain is found in the lumbar spine.   Follow-Up Instructions: No follow-ups on file.      Procedures: No procedures performed  No notes on file    PMFS History: Patient Active Problem List   Diagnosis Date Noted  . Tubular adenoma 10/16/2018  . Influenza vaccination declined 10/16/2018  . Positive depression screening 10/16/2018  . Chronic pain of right knee 10/16/2018  . Decreased pulse 10/16/2018  . Routine general medical examination at a health care facility 09/12/2017  . S/P LASIK surgery 09/12/2017  . Screening for prostate cancer 09/12/2017  . Achilles tendon pain 09/12/2017  . Snoring 09/12/2017  . Witnessed apneic spells 09/12/2017  . Low testosterone 05/23/2016  . Hypogonadism in male 05/23/2016  . Erectile dysfunction 08/03/2011  . Hyperlipidemia 08/03/2011   Past Medical History:  Diagnosis Date  . Dyslipidemia   . ED (erectile dysfunction) 2008  . Hyperlipidemia   . Keloid of skin   . Status post LASIK surgery     Family History  Problem Relation Age of Onset  . Diabetes Mother   . Hypertension Father   . Thyroid disease Sister   . Arthritis Brother   .  Stroke Neg Hx   . Heart disease Neg Hx   . Hyperlipidemia Neg Hx   . Cancer Neg Hx   . Colon cancer Neg Hx   . Rectal cancer Neg Hx   . Stomach cancer Neg Hx     Past Surgical History:  Procedure Laterality Date  . COLONOSCOPY  11/2013   tubular adenoma, Dr. Zenovia Jarred  . LASIK Bilateral 2008   Social History   Occupational History  . Occupation: maintenance    Employer: [employer]  Tobacco Use  . Smoking status: Never Smoker  . Smokeless tobacco: Never Used  Substance and Sexual Activity  . Alcohol use: No    Alcohol/week: 0.0 standard drinks    Comment: occasional on a holiday  . Drug use: No  . Sexual activity: Not on file

## 2018-10-21 ENCOUNTER — Other Ambulatory Visit: Payer: Self-pay | Admitting: Medical

## 2018-10-21 DIAGNOSIS — E785 Hyperlipidemia, unspecified: Secondary | ICD-10-CM

## 2018-10-21 MED ORDER — PRAVASTATIN SODIUM 20 MG PO TABS: 20.0000 mg | ORAL_TABLET | Freq: Every evening | ORAL | 0 refills | Status: DC

## 2018-11-21 ENCOUNTER — Encounter: Payer: Self-pay | Admitting: Medical

## 2018-12-01 ENCOUNTER — Encounter: Payer: Self-pay | Admitting: Internal Medicine

## 2019-07-31 ENCOUNTER — Other Ambulatory Visit: Payer: Self-pay | Admitting: Medical

## 2019-07-31 ENCOUNTER — Telehealth: Payer: Self-pay | Admitting: Medical

## 2019-07-31 MED ORDER — SILDENAFIL CITRATE 100 MG PO TABS: 100.0000 mg | ORAL_TABLET | Freq: Every day | ORAL | 2 refills | Status: DC | PRN

## 2019-07-31 NOTE — Telephone Encounter (Signed)
Pt needs refill Viagra to [facility], he has CPE scheduled 10/21/19

## 2019-08-02 NOTE — Telephone Encounter (Signed)
done

## 2019-08-28 ENCOUNTER — Telehealth: Payer: Self-pay | Admitting: Family Medicine

## 2019-08-28 NOTE — Telephone Encounter (Signed)
Patient called wanted to find out about testing for the covid virus.  I gave him the [facility] information and advised him to quarantine until results.  He said he thinks he has it.  Also offered him a virtual visit.

## 2019-09-04 ENCOUNTER — Other Ambulatory Visit: Payer: Self-pay

## 2019-09-04 ENCOUNTER — Encounter: Payer: Self-pay | Admitting: Family Medicine

## 2019-09-04 ENCOUNTER — Ambulatory Visit: Payer: 59 | Admitting: Family Medicine

## 2019-09-04 DIAGNOSIS — U071 COVID-19: Secondary | ICD-10-CM | POA: Diagnosis not present

## 2019-09-04 MED ORDER — HYDROCOD POLST-CPM POLST ER 10-8 MG/5ML PO SUER: 5.0000 mL | Freq: Two times a day (BID) | ORAL | 0 refills | Status: DC | PRN

## 2019-09-04 NOTE — Progress Notes (Signed)
   Subjective:    Patient ID: Juan Fleming, male    DOB: 1960-12-16, 58 y.o.   MRN: [id number]  HPI Documentation for virtual telephone encounter.  Documentation for virtual audio and video telecommunications through [place] encounter: The patient was located at home. The provider was located in the office. The patient did consent to this visit and is aware of possible charges through their insurance for this visit. The other persons participating in this telemedicine service were none. This virtual service is not related to other E/M service within previous 7 days. He states that on December 7 he developed a decreased appetite as well as taste and on the 10th was tested for Covid and was positive.  He did fairly well until the last several days when he is now complaining of a cough but no fever, chills, shortness of breath, sore throat or earache.  His appetite and taste are improving.  He has tried OTC medications with minimal relief.  He is supposed to go back to work next Monday.   Review of Systems     Objective:   Physical Exam Alert and in no distress.  Breathing pattern appears normal.       Assessment & Plan:  COVID-19 - Plan: chlorpheniramine-HYDROcodone (TUSSIONEX PENNKINETIC ER) 10-8 MG/5ML SUER Recommend he first try NyQuil to help with his cough and if that does not work then go pick up the prescription for Tussionex.  Explained that as long as he is getting better by Monday he can  return to work.  If he has any questions, he will call back on Monday.  He was comfortable with that.

## 2019-10-21 ENCOUNTER — Encounter: Payer: 59 | Admitting: Medical

## 2020-03-02 ENCOUNTER — Other Ambulatory Visit: Payer: Self-pay

## 2020-03-02 ENCOUNTER — Encounter: Payer: Self-pay | Admitting: Medical

## 2020-03-02 ENCOUNTER — Ambulatory Visit: Payer: 59 | Admitting: Medical

## 2020-03-02 VITALS — BP 118/84 | HR 90 | Ht 70.0 in | Wt 200.0 lb

## 2020-03-02 DIAGNOSIS — M79671 Pain in right foot: Secondary | ICD-10-CM | POA: Diagnosis not present

## 2020-03-02 DIAGNOSIS — R200 Anesthesia of skin: Secondary | ICD-10-CM

## 2020-03-02 DIAGNOSIS — M79672 Pain in left foot: Secondary | ICD-10-CM

## 2020-03-02 DIAGNOSIS — M722 Plantar fascial fibromatosis: Secondary | ICD-10-CM

## 2020-03-02 DIAGNOSIS — Z131 Encounter for screening for diabetes mellitus: Secondary | ICD-10-CM | POA: Diagnosis not present

## 2020-03-02 DIAGNOSIS — M2140 Flat foot [pes planus] (acquired), unspecified foot: Secondary | ICD-10-CM | POA: Insufficient documentation

## 2020-03-02 DIAGNOSIS — M2141 Flat foot [pes planus] (acquired), right foot: Secondary | ICD-10-CM

## 2020-03-02 DIAGNOSIS — M2142 Flat foot [pes planus] (acquired), left foot: Secondary | ICD-10-CM

## 2020-03-02 DIAGNOSIS — R202 Paresthesia of skin: Secondary | ICD-10-CM

## 2020-03-02 NOTE — Progress Notes (Signed)
Subjective: Chief Complaint  Patient presents with  . Foot Pain    right   . Knee Pain    right    Here for ongoing problems with his legs and feet.  I have seen him in the past for this.  We actually referred him to orthopedics January 2020 for the same issues.  He continues to report ongoing problems . He notes pains in both feet including first thing in the morning in the palm of the feet.  He feels numb at times in both feet worse on the right.  He feels a throbbing sensation of the lower right lateral leg that is intermittent.  He sometimes gets pain in the right knee.  His feet hurt at the end of the day and sometimes cannot stand because of the pain.  He has seen podiatry in years past and had steroid shots in his feet but that did not help.  He denies any recent trauma or injury.  He was concerned more recently with the numbness because a family member has numbness and chronic problems in their legs as well.  He denies alcoholism or heavy alcohol use.  He does not drink alcohol currently.  He is wanting to be screened for diabetes.  No other aggravating or relieving factors. No other complaint.  Past Medical History:  Diagnosis Date  . Dyslipidemia   . ED (erectile dysfunction) 2008  . Hyperlipidemia   . Keloid of skin   . Status post LASIK surgery    Current Outpatient Medications on File Prior to Visit  Medication Sig Dispense Refill  . chlorpheniramine-HYDROcodone (TUSSIONEX PENNKINETIC ER) 10-8 MG/5ML SUER Take 5 mLs by mouth every 12 (twelve) hours as needed for cough. (Patient not taking: Reported on 03/02/2020) 140 mL 0  . etodolac (LODINE) 400 MG tablet Take 1 tablet (400 mg total) by mouth 2 (two) times daily as needed. (Patient not taking: Reported on 03/02/2020) 60 tablet 3  . pravastatin (PRAVACHOL) 20 MG tablet Take 1 tablet (20 mg total) by mouth every evening. 90 tablet 0  . sildenafil (VIAGRA) 100 MG tablet Take 1 tablet (100 mg total) by mouth daily as needed. 1/2  tablet daily prn (Patient not taking: Reported on 03/02/2020) 10 tablet 2   No current facility-administered medications on file prior to visit.   Past Surgical History:  Procedure Laterality Date  . COLONOSCOPY  11/2013   tubular adenoma, Dr. Zenovia Jarred  . LASIK Bilateral 2008   Review of systems as in subjective   Objective: BP 118/84   Pulse 90   Ht 5\' 10"  (1.778 m)   Wt 200 lb (90.7 kg)   SpO2 98%   BMI 28.70 kg/m   Gen: wd, wn, nad African-American male Skin of legs and feet unremarkable He is flat-footed Tender along volar right foot under the ball of the foot, mild tenderness of right Achilles, otherwise foot nontender no swelling or deformity otherwise, legs nontender, right knee nontender with normal range of motion, no laxity. There is slight decreased monofilament sensation in the right great toe but otherwise sensation intact, otherwise legs neurovascularly intact, 2+ pulses, normal strength, normal DTRs of knees Hips nontender, range of motion seems a little decreased internal range of motion of both hips No lower extremity edema Back nontender normal range of motion    Assessment: Encounter Diagnoses  Name Primary?  . Plantar fascia syndrome Yes  . Foot pain, bilateral   . Numbness and tingling of both legs   .  Screening for diabetes mellitus   . Pes planus of both feet     Plan: We discussed his symptoms and concerns.  I reviewed the January 2020 orthopedic note with Dr. Junius Roads.  At that time he was advised physical therapy which he did a few sessions.  He was given anti-inflammatory.  He apparently never followed up.  The next step was going to be MRI lumbar spine for possible radiculopathy.  At this point he seems to have plantar fasciitis issues, he is in still toed shoes throughout the day he is flat-footed.  He will begin plantar fascia splints at bedtime and advised that it could take a couple weeks before he sees big improvements.  Avoid flip-flops  and going barefooted..  We discussed wearing a arch support in his shoes.  We discussed morning towel stretch and tennis ball massage of the feet  Labs today to screen for causes of numbness  Declined referral to podiatry today.  If symptoms do not start improving over the next few weeks consider lumbar spine imaging, nerve conduction test or refer back to Ortho  Olly was seen today for foot pain and knee pain.  Diagnoses and all orders for this visit:  Plantar fascia syndrome  Foot pain, bilateral  Numbness and tingling of both legs -     Vitamin B12 -     Hemoglobin A1c  Screening for diabetes mellitus -     Hemoglobin A1c  Pes planus of both feet   F/u pending labs

## 2020-03-02 NOTE — Patient Instructions (Signed)
Recommendations:  Plantar fascitis  Every morning try doing a tennis ball massage with feet on the floor and towel stretch behind the ball of the foot to stretch the plantar fascia  Order plantar fascitis night time 90 degree splints online, through [employer] for example.   They are typically about $25 each  Avoid going barefoot since your feet flatten out without good arch support  I recommend you use arch support shoe INSERTS.   This will help support the plantar fascia and arches  You can use over the counter pain reliever for worse pain    Plantar Fasciitis Plantar fasciitis is a painful foot condition that affects the heel. It occurs when the band of tissue that connects the toes to the heel bone (plantar fascia) becomes irritated. This can happen after exercising too much or doing other repetitive activities (overuse injury). The pain from plantar fasciitis can range from mild irritation to severe pain that makes it difficult for you to walk or move. The pain is usually worse in the morning or after you have been sitting or lying down for a while. What are the causes? This condition may be caused by:  Standing for long periods of time.  Wearing shoes that do not fit.  Doing high-impact activities, including running, aerobics, and ballet.  Being overweight.  Having an abnormal way of walking (gait).  Having tight calf muscles.  Having high arches in your feet.  Starting a new athletic activity.  What are the signs or symptoms? The main symptom of this condition is heel pain. Other symptoms include:  Pain that gets worse after activity or exercise.  Pain that is worse in the morning or after resting.  Pain that goes away after you walk for a few minutes.  How is this diagnosed? This condition may be diagnosed based on your signs and symptoms. Your health care provider will also do a physical exam to check for:  A tender area on the bottom of your foot.  A high  arch in your foot.  Pain when you move your foot.  Difficulty moving your foot.  You may also need to have imaging studies to confirm the diagnosis. These can include:  X-rays.  Ultrasound.  MRI.  How is this treated? Treatment for plantar fasciitis depends on the severity of the condition. Your treatment may include:  Rest, ice, and over-the-counter pain medicines to manage your pain.  Exercises to stretch your calves and your plantar fascia.  A splint that holds your foot in a stretched, upward position while you sleep (night splint).  Physical therapy to relieve symptoms and prevent problems in the future.  Cortisone injections to relieve severe pain.  Extracorporeal shock wave therapy (ESWT) to stimulate damaged plantar fascia with electrical impulses. It is often used as a last resort before surgery.  Surgery, if other treatments have not worked after 12 months.  Follow these instructions at home:  Take medicines only as directed by your health care provider.  Avoid activities that cause pain.  Roll the bottom of your foot over a bag of ice or a bottle of cold water. Do this for 20 minutes, 3-4 times a day.  Perform simple stretches as directed by your health care provider.  Try wearing athletic shoes with air-sole or gel-sole cushions or soft shoe inserts.  Wear a night splint while sleeping, if directed by your health care provider.  Keep all follow-up appointments with your health care provider. How is this prevented?  Do not perform exercises or activities that cause heel pain.  Consider finding low-impact activities if you continue to have problems.  Lose weight if you need to. The best way to prevent plantar fasciitis is to avoid the activities that aggravate your plantar fascia. Contact a health care provider if:  Your symptoms do not go away after treatment with home care measures.  Your pain gets worse.  Your pain affects your ability to move  or do your daily activities. This information is not intended to replace advice given to you by your health care provider. Make sure you discuss any questions you have with your health care provider. Document Released: 05/30/2001 Document Revised: 02/07/2016 Document Reviewed: 07/15/2014 Elsevier Interactive Patient Education  [employer].

## 2020-03-03 LAB — HEMOGLOBIN A1C
Est. average glucose Bld gHb Est-mCnc: 114 mg/dL
Hgb A1c MFr Bld: 5.6 % (ref 4.8–5.6)

## 2020-03-03 LAB — VITAMIN B12: Vitamin B-12: 338 pg/mL (ref 232–1245)

## 2020-05-27 ENCOUNTER — Other Ambulatory Visit: Payer: Self-pay

## 2020-05-27 ENCOUNTER — Encounter: Payer: Self-pay | Admitting: Medical

## 2020-05-27 ENCOUNTER — Ambulatory Visit: Payer: 59 | Admitting: Medical

## 2020-05-27 VITALS — BP 118/70 | HR 99 | Ht 70.0 in | Wt 205.6 lb

## 2020-05-27 DIAGNOSIS — M545 Low back pain, unspecified: Secondary | ICD-10-CM

## 2020-05-27 DIAGNOSIS — M5410 Radiculopathy, site unspecified: Secondary | ICD-10-CM

## 2020-05-27 DIAGNOSIS — M722 Plantar fascial fibromatosis: Secondary | ICD-10-CM

## 2020-05-27 DIAGNOSIS — M79672 Pain in left foot: Secondary | ICD-10-CM

## 2020-05-27 DIAGNOSIS — R200 Anesthesia of skin: Secondary | ICD-10-CM | POA: Diagnosis not present

## 2020-05-27 DIAGNOSIS — E785 Hyperlipidemia, unspecified: Secondary | ICD-10-CM

## 2020-05-27 DIAGNOSIS — M79604 Pain in right leg: Secondary | ICD-10-CM | POA: Diagnosis not present

## 2020-05-27 DIAGNOSIS — L989 Disorder of the skin and subcutaneous tissue, unspecified: Secondary | ICD-10-CM

## 2020-05-27 DIAGNOSIS — M2142 Flat foot [pes planus] (acquired), left foot: Secondary | ICD-10-CM

## 2020-05-27 DIAGNOSIS — M79671 Pain in right foot: Secondary | ICD-10-CM

## 2020-05-27 DIAGNOSIS — M2141 Flat foot [pes planus] (acquired), right foot: Secondary | ICD-10-CM

## 2020-05-27 DIAGNOSIS — N529 Male erectile dysfunction, unspecified: Secondary | ICD-10-CM

## 2020-05-27 MED ORDER — HYDROCORTISONE 2.5 % EX CREA: TOPICAL_CREAM | Freq: Two times a day (BID) | CUTANEOUS | 0 refills | Status: DC

## 2020-05-27 MED ORDER — SILDENAFIL CITRATE 100 MG PO TABS: 100.0000 mg | ORAL_TABLET | Freq: Every day | ORAL | 2 refills | Status: DC | PRN

## 2020-05-27 MED ORDER — PRAVASTATIN SODIUM 20 MG PO TABS: 20.0000 mg | ORAL_TABLET | Freq: Every evening | ORAL | 0 refills | Status: DC

## 2020-05-27 NOTE — Patient Instructions (Signed)
You have ongoing pains in your right leg including hip, thigh, knee, lower leg, foot  You also have ongoing numbness in the right leg, balance concerns  You have plantar fascitis   Lets get xrays of lumbar spine and right hip to help further evaluate   You may ultimately need to see a neurologist for nerve studies.   You may ultimately need to go back and see podiatry/foot doctor   You may need MRI lumbar spine once we see results of the xrays    Please go to [facility] for your lumbar and hip xray.   Their hours are 8am - 4:30 pm Monday - Friday.  Take your insurance card with you.  [facility] [phone]  [address], [place], [place] [address]  [address] [place], [place] [address]

## 2020-05-27 NOTE — Progress Notes (Signed)
Referral sent 

## 2020-05-27 NOTE — Progress Notes (Signed)
Subjective: Chief Complaint  Patient presents with  . Follow-up    right foot pain and numbness    Here for ongoing problems with his legs and feet.  I saw him back in June 2021 for the same.   We actually referred him to orthopedics January 2020 for the same issues.  He continues to report ongoing problems  For the most recent months he is primarily been bothered by his right leg.  Even though he is complained about left leg issues in the past, it has mostly been the right leg in recent months.  In recent months he notes pain in the right hip, pain in the right knee, pain in the right foot, pain in the bottom of the foot, numbness throughout the entire leg.  He is even having to use a cane at times due to the pain.  Cannot put weight on the leg at times.  Numbness seems throughout, decreased sensation.  He went to a chiropractor recently and they did some test on his leg and they noted some decreased sensory throughout the leg.  He has seen orthopedics in the past for the same issue, podiatry in the past had steroid shots in the foot before.  Orthopedics mention some imaging of the back.  He has not been to a neurologist.  No recent fall or injury.  In recent months he used nighttime foot splints for plantar fasciitis.  That still has not helped.  He denies alcoholism or heavy alcohol use.  He does not drink alcohol currently.   He needs refills on medicines for cholesterol erectile dysfunction.  The erectile dysfunction medicine works fine.  He has been out of his cholesterol medicine.  No other aggravating or relieving factors. No other complaint.  Past Medical History:  Diagnosis Date  . Dyslipidemia   . ED (erectile dysfunction) 2008  . Hyperlipidemia   . Keloid of skin   . Status post LASIK surgery    Current Outpatient Medications on File Prior to Visit  Medication Sig Dispense Refill  . chlorpheniramine-HYDROcodone (TUSSIONEX PENNKINETIC ER) 10-8 MG/5ML SUER Take 5 mLs by  mouth every 12 (twelve) hours as needed for cough. (Patient not taking: Reported on 03/02/2020) 140 mL 0  . etodolac (LODINE) 400 MG tablet Take 1 tablet (400 mg total) by mouth 2 (two) times daily as needed. (Patient not taking: Reported on 03/02/2020) 60 tablet 3   No current facility-administered medications on file prior to visit.   Past Surgical History:  Procedure Laterality Date  . COLONOSCOPY  11/2013   tubular adenoma, Dr. Zenovia Jarred  . LASIK Bilateral 2008   Review of systems as in subjective   Objective: BP 118/70   Pulse 99   Ht 5\' 10"  (1.778 m)   Wt 205 lb 9.6 oz (93.3 kg)   SpO2 98%   BMI 29.50 kg/m   Gen: wd, wn, nad African-American male Skin of legs and feet unremarkable, top of forehead left of midline with a small 3 mm round slightly raised lesion with a depressed center, flesh-colored but slight pearly appearance He is flat-footed bilaterally Right foot tender along volar right foot under the ball of the foot, mild tenderness of right Achilles, decreased internal range of motion of right hip, otherwise leg nonspecific tender but he notes pain throughout.  No swelling or other deformity.  Range of motion of knee and ankle and toes seem normal.  Left leg exam unremarkable Pulses capillary refill normal of legs Strength of  legs seem normal bilaterally, but there is generalized decreased sensation of right leg, more of a dullness compared to the left leg throughout DTRs 1+ bilaterally upper and lower extremities    Assessment: Encounter Diagnoses  Name Primary?  . Right leg pain Yes  . Right leg numbness   . Plantar fasciitis   . Low back pain, unspecified back pain laterality, unspecified chronicity, unspecified whether sciatica present   . Right foot pain   . Radicular pain of right lower extremity   . Skin lesion   . Hyperlipidemia, unspecified hyperlipidemia type   . Erectile dysfunction, unspecified erectile dysfunction type   . Foot pain, bilateral   .  Pes planus of both feet     Plan: Right leg pain, radicular pain, plantar fasciitis, right leg numbness  I reviewed the January 2020 orthopedic note with Dr. Junius Roads.  At that time he was advised physical therapy which he did a few sessions.  He was given anti-inflammatory.  He apparently never followed up.  The next step was going to be MRI lumbar spine for possible radiculopathy.  He has failed conservative therapy and seems to be getting worse  I will send him for baseline x-rays of lumbar spine and right hip as he also has some newer hip pains and decreased internal range of motion  Pending x-rays will likely get neurology and podiatry involved   Hyperlipidemia-refill cholesterol medicine, advised to return soon for fasting physical as it has been over a year since last physical  Erectile dysfunction-continue current medication  Skin lesion on top of head-he can try short-term hydrocortisone as below that we will go ahead and refer to dermatology as I doubt the hydrocortisone will clear this lesion up.  May need biopsy  Tylin was seen today for follow-up.  Diagnoses and all orders for this visit:  Right leg pain -     DG Hip Unilat W OR W/O Pelvis 2-3 Views Right; Future -     DG Lumbar Spine Complete; Future  Right leg numbness -     DG Hip Unilat W OR W/O Pelvis 2-3 Views Right; Future -     DG Lumbar Spine Complete; Future  Plantar fasciitis -     DG Lumbar Spine Complete; Future  Low back pain, unspecified back pain laterality, unspecified chronicity, unspecified whether sciatica present -     DG Lumbar Spine Complete; Future  Right foot pain -     DG Lumbar Spine Complete; Future  Radicular pain of right lower extremity  Skin lesion -     Ambulatory referral to Dermatology  Hyperlipidemia, unspecified hyperlipidemia type  Erectile dysfunction, unspecified erectile dysfunction type  Foot pain, bilateral  Pes planus of both feet  Other orders -      sildenafil (VIAGRA) 100 MG tablet; Take 1 tablet (100 mg total) by mouth daily as needed. 1/2 tablet daily prn -     hydrocortisone 2.5 % cream; Apply topically 2 (two) times daily. -     pravastatin (PRAVACHOL) 20 MG tablet; Take 1 tablet (20 mg total) by mouth every evening.   F/u pending x-rays

## 2020-06-07 ENCOUNTER — Ambulatory Visit
Admission: RE | Admit: 2020-06-07 | Discharge: 2020-06-07 | Disposition: A | Discharge status: Home / Self Care | Payer: 59 | Source: Ambulatory Visit | Attending: Medical | Admitting: Medical

## 2020-06-07 DIAGNOSIS — R200 Anesthesia of skin: Secondary | ICD-10-CM

## 2020-06-07 DIAGNOSIS — M79671 Pain in right foot: Secondary | ICD-10-CM

## 2020-06-07 DIAGNOSIS — M545 Low back pain, unspecified: Secondary | ICD-10-CM

## 2020-06-07 DIAGNOSIS — M722 Plantar fascial fibromatosis: Secondary | ICD-10-CM

## 2020-06-07 DIAGNOSIS — M79604 Pain in right leg: Secondary | ICD-10-CM

## 2020-06-17 ENCOUNTER — Other Ambulatory Visit: Payer: Self-pay | Admitting: Medical

## 2020-06-28 ENCOUNTER — Other Ambulatory Visit: Payer: Self-pay | Admitting: Medical

## 2020-09-09 ENCOUNTER — Telehealth: Payer: Self-pay | Admitting: Medical

## 2020-09-09 NOTE — Telephone Encounter (Signed)
Pt needs refill on Sildenafil he uses the [facility] on [address].

## 2020-09-10 ENCOUNTER — Other Ambulatory Visit: Payer: Self-pay | Admitting: Medical

## 2020-09-10 MED ORDER — SILDENAFIL CITRATE 100 MG PO TABS: ORAL_TABLET | ORAL | 2 refills | Status: DC

## 2020-11-18 ENCOUNTER — Ambulatory Visit: Payer: 59 | Admitting: Medical

## 2021-05-25 IMAGING — CR DG LUMBAR SPINE COMPLETE 4+V
5 series · 5 of 5 positions shown · non-contrast
Comparison: None.

CLINICAL DATA: Pt c/o lower back pain that radiates down R hip x2-3
years non injury; no surgeries to R hip or lspine

EXAM:
LUMBAR SPINE - COMPLETE 4+ VIEW

[t l-spine a.p.]
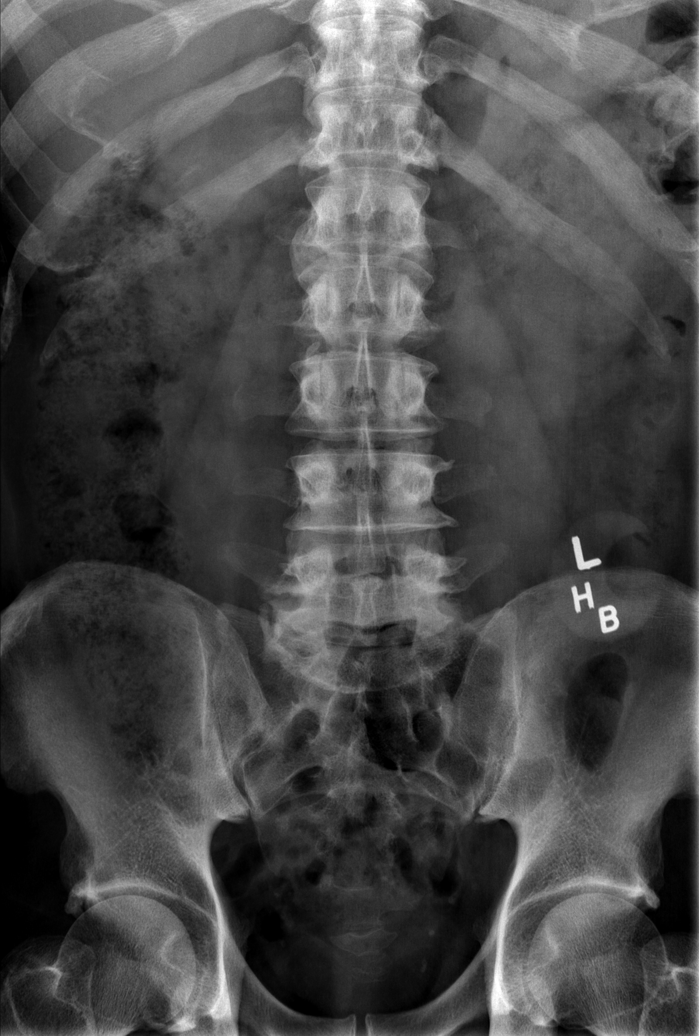

[t l-spine oblique exposure (1 of 2)]
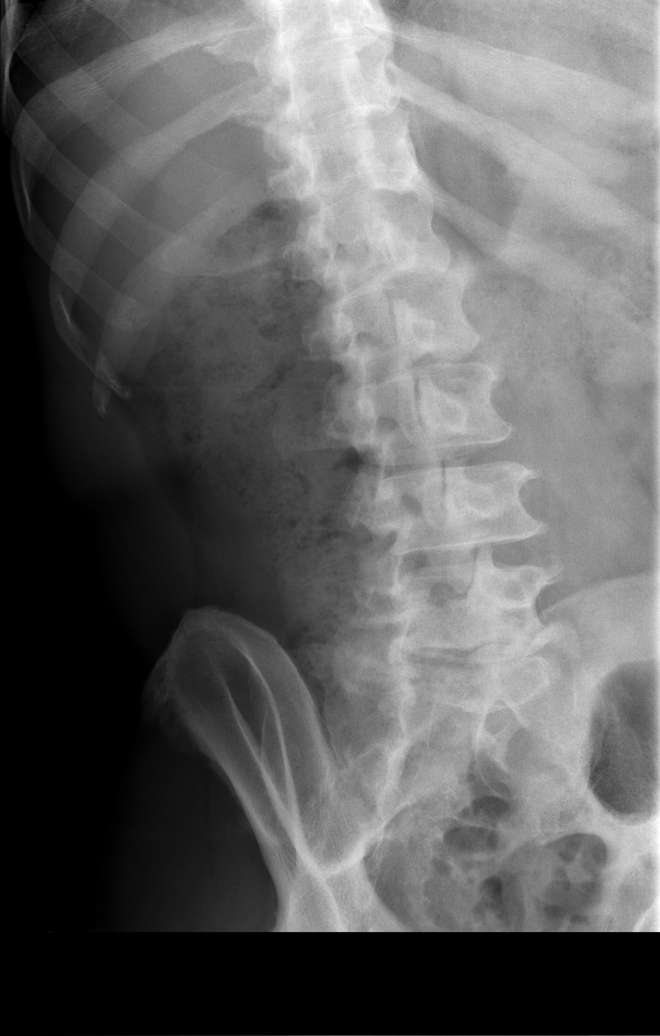

[t l-spine oblique exposure (2 of 2)]
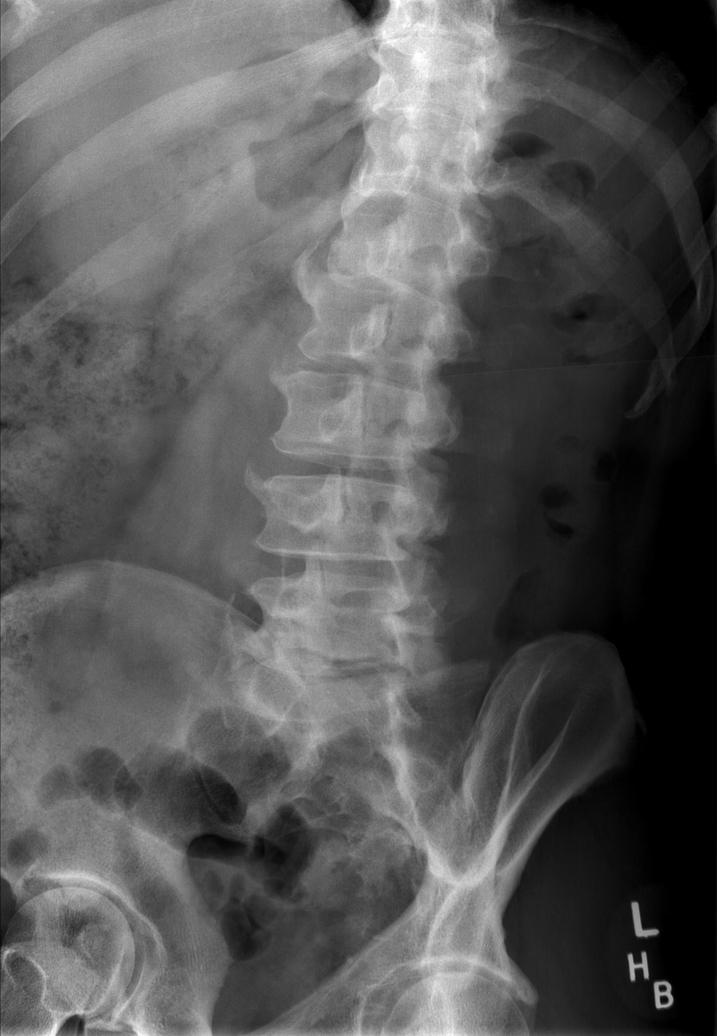

[t l-spine lat]
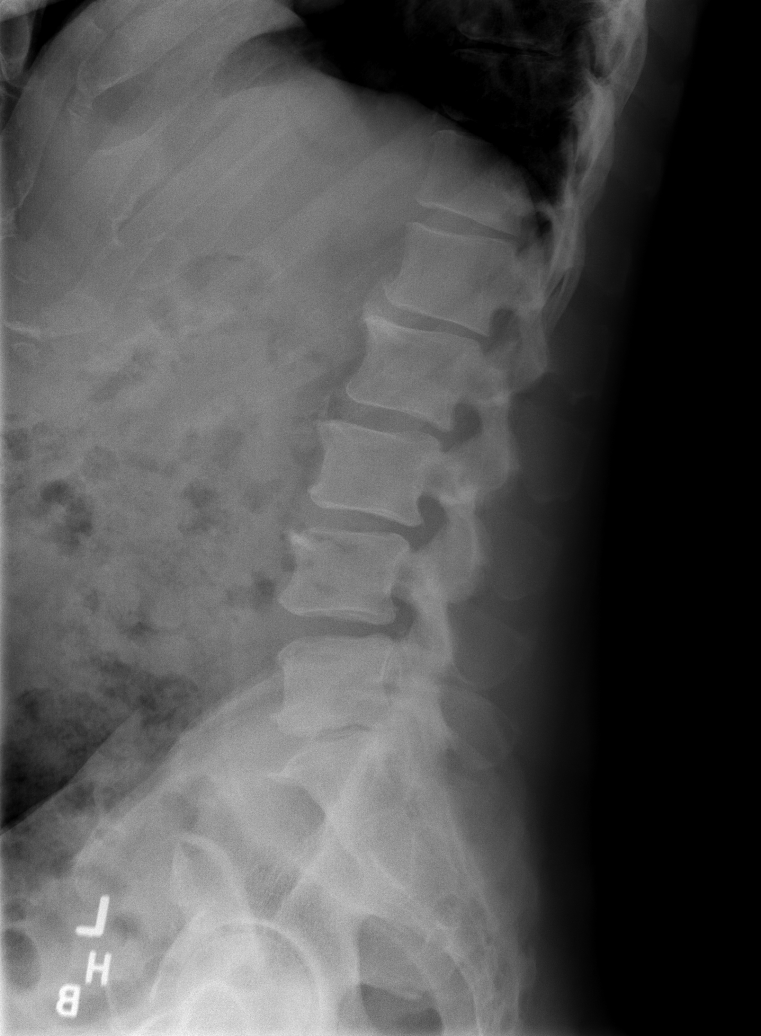

[t l-spine l5-s1 spot]
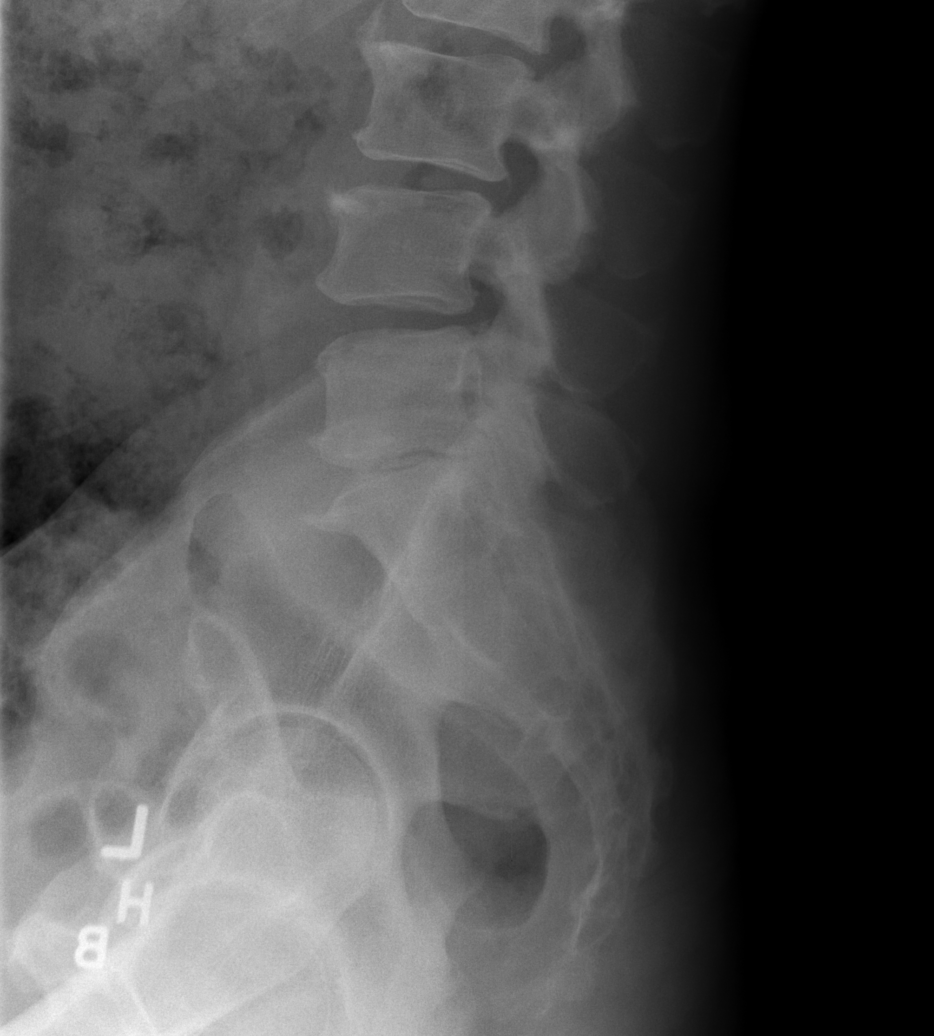

[5 of 5 positions shown; findings below may reference images not displayed]

FINDINGS: Normal alignment. Vertebral body heights are maintained. There is
moderate intervertebral disc space loss at L1-2 and L5-S1. There is
multilevel anterior osteophytosis. No focal bone lesion. SI joints
are open and symmetric. Nonobstructive bowel gas pattern.
IMPRESSION: No acute osseous abnormality in the lumbar spine. Multilevel
degenerative changes most pronounced at L5-S1.

## 2021-07-13 ENCOUNTER — Ambulatory Visit: Payer: 59 | Admitting: Medical

## 2021-07-13 ENCOUNTER — Other Ambulatory Visit: Payer: Self-pay

## 2021-07-13 VITALS — BP 110/68 | HR 100 | Temp 97.3°F | Wt 198.2 lb

## 2021-07-13 DIAGNOSIS — G5791 Unspecified mononeuropathy of right lower limb: Secondary | ICD-10-CM

## 2021-07-13 DIAGNOSIS — M5416 Radiculopathy, lumbar region: Secondary | ICD-10-CM

## 2021-07-13 DIAGNOSIS — M5442 Lumbago with sciatica, left side: Secondary | ICD-10-CM

## 2021-07-13 DIAGNOSIS — M79606 Pain in leg, unspecified: Secondary | ICD-10-CM

## 2021-07-13 DIAGNOSIS — G8929 Other chronic pain: Secondary | ICD-10-CM

## 2021-07-13 DIAGNOSIS — M5441 Lumbago with sciatica, right side: Secondary | ICD-10-CM

## 2021-07-13 DIAGNOSIS — W19XXXA Unspecified fall, initial encounter: Secondary | ICD-10-CM

## 2021-07-13 NOTE — Progress Notes (Signed)
Subjective:  Juan Fleming is a 60 y.o. male who presents for Chief Complaint  Patient presents with   Leg Pain    Leg pain, pain on right side and goes up left side at waist line. Leaning towards the right as he can't stand up straight     Here for leg pain, right and left side.  No recent fall , trauma or injury.  If staying still does ok, but otherwise getting pain.  He notes pains in right buttock, left thigh and hip, low back in general.  Has chronic numbness in the right foot.  He did have a fall recently in the last 2 months for the foot giving way.  Legs do feel weak at times.   No abdominal pain, no NVD, no constipation.  Normal BMs. No incontinence or urine or bowels.  No urinary c/o.   No fever.  No genital numbness, no saddle anesthesia.  No falls.    Saw neurology in [place] in September for same symptoms. He notes that right foot has neuropathy.  He has been getting EDSI in lumbar spine, that hasn't helped.  No recent imaging.  Had recent EMG/NCS to prove neuropathy.  Saw podiatry prior to neurology.  They referred him to neurology.  No other aggravating or relieving factors.    No other c/o.  The following portions of the patient's history were reviewed and updated as appropriate: allergies, current medications, past family history, past medical history, past social history, past surgical history and problem list.  ROS Otherwise as in subjective above  Objective: BP 110/68   Pulse 100   Temp (!) 97.3 F (36.3 C)   Wt 198 lb 3.2 oz (89.9 kg)   BMI 28.44 kg/m   General appearance: alert, no distress, well developed, well nourished Abdomen: +bs, soft, non tender, non distended, no masses, no hepatomegaly, no splenomegaly Tender in the lumbar spine midline, otherwise back nontender, he is slow to move and get up.  He seems to have pain when he changes positions in general.  No swelling or other deformity of the back Legs nontender to palpation.  Range of motion  of hips and knees seems normal, otherwise no swelling or deformity. Neuro: Strength seems slightly decreased of left leg compared to right but relatively 4-5 out of 5 strength throughout, DTRs normal of knees but seemed delayed of both ankle reflex, no clonus.  Sensation seems intensified on the right foot compared to left but he can feels soft and sharp sensation Pulses: 2+ radial pulses, 2+ pedal pulses, normal cap refill Ext: no edema   Assessment: Encounter Diagnoses  Name Primary?   Lumbar radicular pain Yes   Chronic bilateral low back pain with bilateral sciatica    Leg pain, diffuse, unspecified laterality    Neuropathy of right foot    Fall, initial encounter      Plan: We discussed his symptoms and concerns.  After talking further he is already seen neurology recently.  He also has used epidural steroid injections without improvement.  He denies any recent imaging since x-ray in 2021.  I look back through the records and he saw orthopedics in 2020 for the similar issues.  He reportedly had recent EMG nerve testing that showed neuropathy of the right foot.  At this point it sounds like he has worsening radicular issues stemming from the lumbar spine.  We will set him up for MRI lumbar spine.  He will likely need to go back to  orthopedics.  He says the neurologist gave him an oral medication, presumably gabapentin.  He will call back to let us know what this is we can update the chart record.  He is not taking it daily but more as needed, but that does not seem to be helping.  Discussed that he likely needs to be on this daily  He is using a cane as needed.  I gave him a work note today and tomorrow.  Advised if he needed more time out of work he needs to contact the neurologist for FMLA help  Of note I do not have any records from neurology.  They are not in our computer system.  [name] was seen today for leg pain.  Diagnoses and all orders for this visit:  Lumbar radicular  pain -     MR Lumbar Spine Wo Contrast; Future  Chronic bilateral low back pain with bilateral sciatica -     MR Lumbar Spine Wo Contrast; Future  Leg pain, diffuse, unspecified laterality -     MR Lumbar Spine Wo Contrast; Future  Neuropathy of right foot -     MR Lumbar Spine Wo Contrast; Future  Fall, initial encounter -     MR Lumbar Spine Wo Contrast; Future   Follow up: pending scan

## 2021-07-19 ENCOUNTER — Other Ambulatory Visit: Payer: Self-pay | Admitting: Medical

## 2021-07-19 NOTE — Telephone Encounter (Signed)
Pt said at last appt he was not on this

## 2021-07-20 NOTE — Telephone Encounter (Signed)
Last conversation pt was not taking this. Pt has not had it refilled in over a year

## 2021-07-21 ENCOUNTER — Telehealth: Payer: Self-pay | Admitting: Medical

## 2021-07-21 NOTE — Telephone Encounter (Signed)
Audelia Acton- please advise about pain  Caryn- can you check status of MRI

## 2021-07-21 NOTE — Telephone Encounter (Signed)
Left message for pt to call me back.  I have sent this to caryn to check the status of this MRI

## 2021-07-21 NOTE — Telephone Encounter (Signed)
Pt called and states that he is still in pain and needs recommendations or medication to help. He states he has not yet heard anything about MRI and would like some relief. Pt uses [facility] in [place] and can be reached at [phone].

## 2021-07-22 NOTE — Telephone Encounter (Signed)
Patient called and notified below, attempted to provide number but states he is driving right now and will call back for [facility] phone number to call and schedule MRI

## 2021-08-08 ENCOUNTER — Other Ambulatory Visit: Payer: Self-pay

## 2021-08-08 ENCOUNTER — Ambulatory Visit
Admission: RE | Admit: 2021-08-08 | Discharge: 2021-08-08 | Disposition: A | Discharge status: Home / Self Care | Payer: 59 | Source: Ambulatory Visit | Attending: Medical | Admitting: Medical

## 2021-08-08 DIAGNOSIS — G5791 Unspecified mononeuropathy of right lower limb: Secondary | ICD-10-CM

## 2021-08-08 DIAGNOSIS — M5442 Lumbago with sciatica, left side: Secondary | ICD-10-CM

## 2021-08-08 DIAGNOSIS — M5416 Radiculopathy, lumbar region: Secondary | ICD-10-CM

## 2021-08-08 DIAGNOSIS — W19XXXA Unspecified fall, initial encounter: Secondary | ICD-10-CM

## 2021-08-08 DIAGNOSIS — G8929 Other chronic pain: Secondary | ICD-10-CM

## 2021-08-08 DIAGNOSIS — M79606 Pain in leg, unspecified: Secondary | ICD-10-CM

## 2021-08-09 ENCOUNTER — Encounter: Payer: Self-pay | Admitting: Medical

## 2021-08-09 ENCOUNTER — Ambulatory Visit: Payer: 59 | Admitting: Medical

## 2021-08-09 VITALS — BP 118/90 | HR 94 | Ht 68.5 in | Wt 192.2 lb

## 2021-08-09 DIAGNOSIS — N529 Male erectile dysfunction, unspecified: Secondary | ICD-10-CM

## 2021-08-09 DIAGNOSIS — Z1211 Encounter for screening for malignant neoplasm of colon: Secondary | ICD-10-CM

## 2021-08-09 DIAGNOSIS — M549 Dorsalgia, unspecified: Secondary | ICD-10-CM | POA: Diagnosis not present

## 2021-08-09 DIAGNOSIS — Z0000 Encounter for general adult medical examination without abnormal findings: Secondary | ICD-10-CM

## 2021-08-09 DIAGNOSIS — Z9889 Other specified postprocedural states: Secondary | ICD-10-CM

## 2021-08-09 DIAGNOSIS — Z136 Encounter for screening for cardiovascular disorders: Secondary | ICD-10-CM

## 2021-08-09 DIAGNOSIS — R0683 Snoring: Secondary | ICD-10-CM

## 2021-08-09 DIAGNOSIS — R0681 Apnea, not elsewhere classified: Secondary | ICD-10-CM

## 2021-08-09 DIAGNOSIS — E785 Hyperlipidemia, unspecified: Secondary | ICD-10-CM

## 2021-08-09 DIAGNOSIS — M48061 Spinal stenosis, lumbar region without neurogenic claudication: Secondary | ICD-10-CM | POA: Diagnosis not present

## 2021-08-09 DIAGNOSIS — Z125 Encounter for screening for malignant neoplasm of prostate: Secondary | ICD-10-CM | POA: Diagnosis not present

## 2021-08-09 DIAGNOSIS — E291 Testicular hypofunction: Secondary | ICD-10-CM

## 2021-08-09 DIAGNOSIS — M5136 Other intervertebral disc degeneration, lumbar region: Secondary | ICD-10-CM

## 2021-08-09 MED ORDER — HYDROCODONE-ACETAMINOPHEN 5-325 MG PO TABS
1.0000 | ORAL_TABLET | Freq: Two times a day (BID) | ORAL | 0 refills | Status: DC | PRN
Start: 2021-08-09 — End: 2022-08-15

## 2021-08-09 MED ORDER — GABAPENTIN 300 MG PO CAPS: 300.0000 mg | ORAL_CAPSULE | Freq: Two times a day (BID) | ORAL | 2 refills | Status: DC

## 2021-08-09 MED ORDER — SILDENAFIL CITRATE 100 MG PO TABS: ORAL_TABLET | ORAL | 2 refills | Status: DC

## 2021-08-09 NOTE — Progress Notes (Addendum)
Subjective: Chief Complaint  Patient presents with   Annual Exam   Back Pain    With numbness in both legs, left> right, knees have been popping. Sees Dr. Trula Ore in [place] for back pain   Here for physical  Medical team: Dr. Zenovia Jarred, GI Dentist Eye doctor Dr. Boyd Kerbs, neurology Sylvan Lahm, Camelia Eng, PA-C here for primary care  Concerns: Here to f/u on recent lumbar MRI.  Still in a lot of pain.  Leg weakness, pain, tingling but no saddle anesthesia, no fall.    Sleep, snoring issues prior, wife has mentioned apnea events- never went for sleep study prior requested  Was on testosterone therapy prior but not currently taking this for the past year  Still working 8-hour days, forklift driver.  Reviewed their medical, surgical, family, social, medication, and allergy history and updated chart as appropriate.  Past Medical History:  Diagnosis Date   ED (erectile dysfunction) 2008   Hyperlipidemia    Keloid of skin    Status post LASIK surgery     Past Surgical History:  Procedure Laterality Date   COLONOSCOPY  11/2013   tubular adenoma, Dr. Zenovia Jarred   LASIK Bilateral 2008    Social History   Socioeconomic History   Marital status: Married    Spouse name: Not on file   Number of children: Not on file   Years of education: Not on file   Highest education level: Not on file  Occupational History   Occupation: maintenance    Employer: [employer]  Tobacco Use   Smoking status: Never   Smokeless tobacco: Never  Vaping Use   Vaping Use: Never used  Substance and Sexual Activity   Alcohol use: No    Alcohol/week: 0.0 standard drinks    Comment: occasional on a holiday   Drug use: No   Sexual activity: Not on file  Other Topics Concern   Not on file  Social History Narrative   Married, 2 children.  Works at [employer], [employer].   07/2021   Social Determinants of Health   Financial Resource Strain: Not on file  Food Insecurity: Not on file   Transportation Needs: Not on file  Physical Activity: Not on file  Stress: Not on file  Social Connections: Not on file  Intimate Partner Violence: Not on file    Family History  Problem Relation Age of Onset   Diabetes Mother    Hypertension Father    Thyroid disease Sister    Arthritis Brother    Stroke Neg Hx    Heart disease Neg Hx    Hyperlipidemia Neg Hx    Cancer Neg Hx    Colon cancer Neg Hx    Rectal cancer Neg Hx    Stomach cancer Neg Hx      Current Outpatient Medications:    HYDROcodone-acetaminophen (NORCO) 5-325 MG tablet, Take 1 tablet by mouth every 12 (twelve) hours as needed., Disp: 30 tablet, Rfl: 0   gabapentin (NEURONTIN) 300 MG capsule, Take 1 capsule (300 mg total) by mouth 2 (two) times daily., Disp: 60 capsule, Rfl: 2   pravastatin (PRAVACHOL) 20 MG tablet, TAKE 1 TABLET(20 MG) BY MOUTH EVERY EVENING (Patient not taking: Reported on 07/13/2021), Disp: 30 tablet, Rfl: 0   sildenafil (VIAGRA) 100 MG tablet, TAKE 1/2 TO 1 TABLET BY MOUTH ONCE DAILY AS NEEDED, Disp: 10 tablet, Rfl: 2  No Known Allergies    Review of Systems Constitutional: -fever, -chills, -sweats, -unexpected weight change, -decreased  appetite, -fatigue Allergy: -sneezing, -itching, -congestion Dermatology: -changing moles, --rash, -lumps ENT: -runny nose, -ear pain, -sore throat, -hoarseness, -sinus pain, -teeth pain, - ringing in ears, -hearing loss, -nosebleeds Cardiology: -chest pain, -palpitations, -swelling, -difficulty breathing when lying flat, -waking up short of breath Respiratory: -cough, -shortness of breath, -difficulty breathing with exercise or exertion, -wheezing, -coughing up blood Gastroenterology: -abdominal pain, -nausea, -vomiting, -diarrhea, -constipation, -blood in stool, -changes in bowel movement, -difficulty swallowing or eating Hematology: -bleeding, -bruising  Musculoskeletal: +joint aches, -muscle aches, -joint swelling, +back pain, -neck pain, -cramping,  -changes in gait Ophthalmology: denies vision changes, eye redness, itching, discharge Urology: -burning with urination, -difficulty urinating, -blood in urine, -urinary frequency, -urgency, -incontinence Neurology: -headache, +leg weakness, -tingling, +numbness, -memory loss, -falls, -dizziness Psychology: -depressed mood, -agitation, -sleep problems Male GU: no testicular mass, pain, no lymph nodes swollen, no swelling, no rash.     Objective:  BP 118/90 (BP Location: Right Arm, Patient Position: Sitting)   Pulse 94   Ht 5' 8.5" (1.74 m)   Wt 192 lb 3.2 oz (87.2 kg)   SpO2 99%   BMI 28.80 kg/m   General appearance: alert, no distress, WD/WN, African American male Skin: skin unremarkable HEENT: normocephalic, conjunctiva/corneas normal, sclerae anicteric, PERRLA, EOMi, nares patent, no discharge or erythema, pharynx normal Oral cavity: MMM, tongue normal, teeth in good repair Neck: supple, no lymphadenopathy, no thyromegaly, no masses, normal ROM, no bruits Chest: non tender, normal shape and expansion Heart: RRR, normal S1, S2, no murmurs Lungs: CTA bilaterally, no wheezes, rhonchi, or rales Abdomen: +bs, soft, non tender, non distended, no masses, no hepatomegaly, no splenomegaly, no bruits Back: pain with ROM, limited ROM, tender lumbar region throughout, normal ROM, no scoliosis Musculoskeletal: right upper lateral thigh with linear 6cm indention chronic,  upper extremities non tender, no obvious deformity, normal ROM throughout, lower extremities non tender, no obvious deformity, normal ROM throughout, legs nontender with relatively normal ROM Extremities: no edema, no cyanosis, no clubbing Pulses: 2+ symmetric, upper and 1+ lower extremities, normal cap refill Neurological: Legs with strength 4-5 out of 5 bilaterally, normal DTRs, cautious and guarded with sudden movement due to pain,  no obvious weakness, alert, oriented x 3, CN2-12 intact, strength normal upper extremities and  lower extremities, sensation normal throughout, DTRs 2+ throughout, no cerebellar signs, gait normal Psychiatric: normal affect, behavior normal, pleasant  GU: normal male external genitalia,circumcised, nontender, no masses, no hernia, no lymphadenopathy Rectal: deferred  EKG Indication, screen for heart disease Rate 85 bpm PR 161ms QRS 39ms QTC 402 ms Axis -23 degrees Nsr No acute change    MRI lumbar spine 08/08/21  IMPRESSION: 1. Marked congenital stenosis of the lumbar canal with superimposed degenerative changes as described. 2. Moderate to severe spinal canal stenosis at L2-L3 with impingement of the traversing left L3 nerve root in the subarticular zone. 3. Severe spinal canal stenosis at L3-L4 with possible mass effect on either traversing nerve root, and moderate left neural foraminal stenosis. 4. Severe spinal canal stenosis with impingement of the traversing cauda equina nerve roots and moderate bilateral neural foraminal stenosis at L4-L5. 5. Disc bulge with superimposed left subarticular zone protrusion at L5-S1 resulting in impingement of the traversing left S1 nerve root. There is severe bilateral neural foraminal stenosis at this level. 6. Multilevel facet arthropathy, most advanced at L4-L5, with prominent perifacetal soft tissue edema on the right at L4-L5.       Assessment and Plan :   Encounter Diagnoses  Name Primary?  Routine general medical examination at a health care facility Yes   Spinal stenosis of lumbar region, unspecified whether neurogenic claudication present    Bulging lumbar disc    Severe back pain    S/P LASIK surgery    Screening for prostate cancer    Snoring    Witnessed apneic spells    Erectile dysfunction, unspecified erectile dysfunction type    Hyperlipidemia, unspecified hyperlipidemia type    Hypogonadism in male    Screen for colon cancer    Screening for heart disease     This visit was a preventative care  visit, also known as wellness visit or routine physical.   Topics typically include healthy lifestyle, diet, exercise, preventative care, vaccinations, sick and well care, proper use of emergency dept and after hours care, as well as other concerns.     Recommendations: Continue to return yearly for your annual wellness and preventative care visits.  This gives [place] a chance to discuss healthy lifestyle, exercise, vaccinations, review your chart record, and perform screenings where appropriate.  I recommend you see your eye doctor yearly for routine vision care.  I recommend you see your dentist yearly for routine dental care including hygiene visits twice yearly.   Vaccination recommendations were reviewed Immunization History  Administered Date(s) Administered   Tdap 10/01/2013   You are due for Shingrix, yearly flu shot, covid booster  You decline these today  Screening for cancer: Colon cancer screening: Referral back to Dr. Hilarie Fredrickson for updated screening  We discussed PSA, prostate exam, and prostate cancer screening risks/benefits.     Skin cancer screening: Check your skin regularly for new changes, growing lesions, or other lesions of concern Come in for evaluation if you have skin lesions of concern.  Lung cancer screening: If you have a greater than 20 pack year history of tobacco use, then you may qualify for lung cancer screening with a chest CT scan.   Please call your insurance company to inquire about coverage for this test.  We currently don't have screenings for other cancers besides breast, cervical, colon, and lung cancers.  If you have a strong family history of cancer or have other cancer screening concerns, please let me know.    Bone health: Get at least 150 minutes of aerobic exercise weekly Get weight bearing exercise at least once weekly Bone density test:  A bone density test is an imaging test that uses a type of X-ray to measure the amount of calcium  and other minerals in your bones. The test may be used to diagnose or screen you for a condition that causes weak or thin bones (osteoporosis), predict your risk for a broken bone (fracture), or determine how well your osteoporosis treatment is working. The bone density test is recommended for females 20 and older, or females or males <44 if certain risk factors such as thyroid disease, long term use of steroids such as for asthma or rheumatological issues, vitamin D deficiency, estrogen deficiency, family history of osteoporosis, self or family history of fragility fracture in first degree relative.    Heart health: Get at least 150 minutes of aerobic exercise weekly Limit alcohol It is important to maintain a healthy blood pressure and healthy cholesterol numbers  Heart disease screening: Screening for heart disease includes screening for blood pressure, fasting lipids, glucose/diabetes screening, BMI height to weight ratio, reviewed of smoking status, physical activity, and diet.    Goals include blood pressure 120/80 or less, maintaining a healthy  lipid/cholesterol profile, preventing diabetes or keeping diabetes numbers under good control, not smoking or using tobacco products, exercising most days per week or at least 150 minutes per week of exercise, and eating healthy variety of fruits and vegetables, healthy oils, and avoiding unhealthy food choices like fried food, fast food, high sugar and high cholesterol foods.    Other tests may possibly include EKG test, CT coronary calcium score, echocardiogram, exercise treadmill stress test.     Medical care options: I recommend you continue to seek care here first for routine care.  We try really hard to have available appointments Monday through Friday daytime hours for sick visits, acute visits, and physicals.  Urgent care should be used for after hours and weekends for significant issues that cannot wait till the next day.  The emergency  department should be used for significant potentially life-threatening emergencies.  The emergency department is expensive, can often have long wait times for less significant concerns, so try to utilize primary care, urgent care, or telemedicine when possible to avoid unnecessary trips to the emergency department.  Virtual visits and telemedicine have been introduced since the pandemic started in 2020, and can be convenient ways to receive medical care.  We offer virtual appointments as well to assist you in a variety of options to seek medical care.    Separate significant issues discussed: Chronic back pain, spinal stenosis - discussed recent abnormal MRI results . Urgent referral to spine surgeon.  Advise no heavy lifting, restart gabapentin 300 twice daily, can use Tylenol during the day or hydrocodone for worse pain.  Caution on sedation of medications  Snoring, witnessed apnea -referral for home sleep study  Hyerperlipidemia -noncompliance of late, labs today, likely restart medication  ED -discussed proper use of Viagra, risk and benefits, refilled medication today    Quintan was seen today for annual exam and back pain.  Diagnoses and all orders for this visit:  Routine general medical examination at a health care facility -     Comprehensive metabolic panel -     CBC with Differential/Platelet -     Lipid panel -     EKG 12-Lead -     PSA  Spinal stenosis of lumbar region, unspecified whether neurogenic claudication present -     AMB referral to orthopedics  Bulging lumbar disc -     AMB referral to orthopedics  Severe back pain -     AMB referral to orthopedics  S/P LASIK surgery  Screening for prostate cancer -     PSA  Snoring  Witnessed apneic spells  Erectile dysfunction, unspecified erectile dysfunction type  Hyperlipidemia, unspecified hyperlipidemia type -     Lipid panel  Hypogonadism in male  Screen for colon cancer  Screening for heart  disease -     EKG 12-Lead  Other orders -     gabapentin (NEURONTIN) 300 MG capsule; Take 1 capsule (300 mg total) by mouth 2 (two) times daily. -     HYDROcodone-acetaminophen (NORCO) 5-325 MG tablet; Take 1 tablet by mouth every 12 (twelve) hours as needed. -     sildenafil (VIAGRA) 100 MG tablet; TAKE 1/2 TO 1 TABLET BY MOUTH ONCE DAILY AS NEEDED  Follow-up pending labs, yearly for physical

## 2021-08-09 NOTE — Addendum Note (Signed)
Addended by: Carlena Hurl on: 08/09/2021 01:50 PM   Modules accepted: Orders

## 2021-08-09 NOTE — Patient Instructions (Addendum)
Recommendations: Begin Gabapentin 300mg  twice daily such as breakfast and dinner Begin Tylenol 325mg  or 500mg  once daily in the morning for pain You can use another plain Tylenol 325mg  or 500mg  in afternoon/mid day if needed Use the Nor co Hydrocodone in evening for pain when home as this can make you sleepy We will make urgent referral for orthopedics/spine surgery given your MRI findings     This visit was a preventative care visit, also known as wellness visit or routine physical.   Topics typically include healthy lifestyle, diet, exercise, preventative care, vaccinations, sick and well care, proper use of emergency dept and after hours care, as well as other concerns.     Recommendations: Continue to return yearly for your annual wellness and preventative care visits.  This gives [place] a chance to discuss healthy lifestyle, exercise, vaccinations, review your chart record, and perform screenings where appropriate.  I recommend you see your eye doctor yearly for routine vision care.  I recommend you see your dentist yearly for routine dental care including hygiene visits twice yearly.   Vaccination recommendations were reviewed Immunization History  Administered Date(s) Administered   Tdap 10/01/2013   You are due for Shingrix, yearly flu shot, covid booster  You decline these today  Screening for cancer: Colon cancer screening: Referral back to Dr. Hilarie Fredrickson for updated screening  We discussed PSA, prostate exam, and prostate cancer screening risks/benefits.     Skin cancer screening: Check your skin regularly for new changes, growing lesions, or other lesions of concern Come in for evaluation if you have skin lesions of concern.  Lung cancer screening: If you have a greater than 20 pack year history of tobacco use, then you may qualify for lung cancer screening with a chest CT scan.   Please call your insurance company to inquire about coverage for this test.  We currently  don't have screenings for other cancers besides breast, cervical, colon, and lung cancers.  If you have a strong family history of cancer or have other cancer screening concerns, please let me know.    Bone health: Get at least 150 minutes of aerobic exercise weekly Get weight bearing exercise at least once weekly Bone density test:  A bone density test is an imaging test that uses a type of X-ray to measure the amount of calcium and other minerals in your bones. The test may be used to diagnose or screen you for a condition that causes weak or thin bones (osteoporosis), predict your risk for a broken bone (fracture), or determine how well your osteoporosis treatment is working. The bone density test is recommended for females 30 and older, or females or males <25 if certain risk factors such as thyroid disease, long term use of steroids such as for asthma or rheumatological issues, vitamin D deficiency, estrogen deficiency, family history of osteoporosis, self or family history of fragility fracture in first degree relative.    Heart health: Get at least 150 minutes of aerobic exercise weekly Limit alcohol It is important to maintain a healthy blood pressure and healthy cholesterol numbers  Heart disease screening: Screening for heart disease includes screening for blood pressure, fasting lipids, glucose/diabetes screening, BMI height to weight ratio, reviewed of smoking status, physical activity, and diet.    Goals include blood pressure 120/80 or less, maintaining a healthy lipid/cholesterol profile, preventing diabetes or keeping diabetes numbers under good control, not smoking or using tobacco products, exercising most days per week or at least 150 minutes per  week of exercise, and eating healthy variety of fruits and vegetables, healthy oils, and avoiding unhealthy food choices like fried food, fast food, high sugar and high cholesterol foods.    Other tests may possibly include EKG  test, CT coronary calcium score, echocardiogram, exercise treadmill stress test.     Medical care options: I recommend you continue to seek care here first for routine care.  We try really hard to have available appointments Monday through Friday daytime hours for sick visits, acute visits, and physicals.  Urgent care should be used for after hours and weekends for significant issues that cannot wait till the next day.  The emergency department should be used for significant potentially life-threatening emergencies.  The emergency department is expensive, can often have long wait times for less significant concerns, so try to utilize primary care, urgent care, or telemedicine when possible to avoid unnecessary trips to the emergency department.  Virtual visits and telemedicine have been introduced since the pandemic started in 2020, and can be convenient ways to receive medical care.  We offer virtual appointments as well to assist you in a variety of options to seek medical care.    Separate significant issues discussed: Chronic back pain, spinal stenosis - discussed recent abnormal MRI results . Urgent referral to spine surgeon.  Advise no heavy lifting, restart gabapentin 300 twice daily, can use Tylenol during the day or hydrocodone for worse pain.  Caution on sedation of medications  Snoring, witnessed apnea -referral for home sleep study  Hyerperlipidemia -noncompliance of late, labs today, likely restart medication  ED -discussed proper use of Viagra, risk and benefits, refilled medication today

## 2021-08-10 ENCOUNTER — Encounter: Payer: Self-pay | Admitting: Medical

## 2021-08-10 ENCOUNTER — Other Ambulatory Visit: Payer: Self-pay | Admitting: Medical

## 2021-08-10 DIAGNOSIS — R7989 Other specified abnormal findings of blood chemistry: Secondary | ICD-10-CM

## 2021-08-10 LAB — COMPREHENSIVE METABOLIC PANEL
ALT: 20 IU/L (ref 0–44)
AST: 23 IU/L (ref 0–40)
Albumin/Globulin Ratio: 1.5 (ref 1.2–2.2)
Albumin: 4.1 g/dL (ref 3.8–4.9)
Alkaline Phosphatase: 87 IU/L (ref 44–121)
BUN/Creatinine Ratio: 13 (ref 10–24)
BUN: 18 mg/dL (ref 8–27)
Bilirubin Total: 0.4 mg/dL (ref 0.0–1.2)
CO2: 26 mmol/L (ref 20–29)
Calcium: 10.1 mg/dL (ref 8.6–10.2)
Chloride: 99 mmol/L (ref 96–106)
Creatinine, Ser: 1.43 mg/dL — ABNORMAL HIGH (ref 0.76–1.27)
Globulin, Total: 2.7 g/dL (ref 1.5–4.5)
Glucose: 88 mg/dL (ref 70–99)
Potassium: 5 mmol/L (ref 3.5–5.2)
Sodium: 138 mmol/L (ref 134–144)
Total Protein: 6.8 g/dL (ref 6.0–8.5)
eGFR: 56 mL/min/{1.73_m2} — ABNORMAL LOW (ref >59)

## 2021-08-10 LAB — CBC WITH DIFFERENTIAL/PLATELET
Basophils Absolute: 0 10*3/uL (ref 0.0–0.2)
Basos: 1 %
EOS (ABSOLUTE): 0.1 10*3/uL (ref 0.0–0.4)
Eos: 1 %
Hematocrit: 48.9 % (ref 37.5–51.0)
Hemoglobin: 16.1 g/dL (ref 13.0–17.7)
Immature Grans (Abs): 0 10*3/uL (ref 0.0–0.1)
Immature Granulocytes: 0 %
Lymphocytes Absolute: 1.9 10*3/uL (ref 0.7–3.1)
Lymphs: 33 %
MCH: 28 pg (ref 26.6–33.0)
MCHC: 32.9 g/dL (ref 31.5–35.7)
MCV: 85 fL (ref 79–97)
Monocytes Absolute: 0.7 10*3/uL (ref 0.1–0.9)
Monocytes: 11 %
Neutrophils Absolute: 3.1 10*3/uL (ref 1.4–7.0)
Neutrophils: 54 %
Platelets: 344 10*3/uL (ref 150–450)
RBC: 5.76 x10E6/uL (ref 4.14–5.80)
RDW: 12.8 % (ref 11.6–15.4)
WBC: 5.8 10*3/uL (ref 3.4–10.8)

## 2021-08-10 LAB — LIPID PANEL
Chol/HDL Ratio: 4.2 ratio (ref 0.0–5.0)
Cholesterol, Total: 240 mg/dL — ABNORMAL HIGH (ref 100–199)
HDL: 57 mg/dL (ref >39)
LDL Chol Calc (NIH): 162 mg/dL — ABNORMAL HIGH (ref 0–99)
Triglycerides: 117 mg/dL (ref 0–149)
VLDL Cholesterol Cal: 21 mg/dL (ref 5–40)

## 2021-08-10 LAB — PSA: Prostate Specific Ag, Serum: 2 ng/mL (ref 0.0–4.0)

## 2021-08-10 NOTE — Progress Notes (Signed)
Bmet 

## 2021-08-22 ENCOUNTER — Other Ambulatory Visit: Payer: Self-pay | Admitting: Orthopedic Surgery

## 2021-08-22 DIAGNOSIS — M48062 Spinal stenosis, lumbar region with neurogenic claudication: Secondary | ICD-10-CM

## 2021-08-24 ENCOUNTER — Other Ambulatory Visit: Payer: 59

## 2021-08-24 ENCOUNTER — Other Ambulatory Visit: Payer: Self-pay

## 2021-08-24 DIAGNOSIS — R7989 Other specified abnormal findings of blood chemistry: Secondary | ICD-10-CM

## 2021-08-24 LAB — BASIC METABOLIC PANEL
BUN/Creatinine Ratio: 12 (ref 10–24)
BUN: 15 mg/dL (ref 8–27)
CO2: 26 mmol/L (ref 20–29)
Calcium: 9.9 mg/dL (ref 8.6–10.2)
Chloride: 99 mmol/L (ref 96–106)
Creatinine, Ser: 1.26 mg/dL (ref 0.76–1.27)
Glucose: 91 mg/dL (ref 70–99)
Potassium: 4.8 mmol/L (ref 3.5–5.2)
Sodium: 136 mmol/L (ref 134–144)
eGFR: 65 mL/min/{1.73_m2} (ref >59)

## 2021-08-24 LAB — URINALYSIS
Bilirubin, UA: NEGATIVE
Glucose, UA: NEGATIVE
Ketones, UA: NEGATIVE
Leukocytes,UA: NEGATIVE
Nitrite, UA: NEGATIVE
Protein,UA: NEGATIVE
RBC, UA: NEGATIVE
Specific Gravity, UA: 1.008 (ref 1.005–1.030)
Urobilinogen, Ur: 0.2 mg/dL (ref 0.2–1.0)
pH, UA: 6 (ref 5.0–7.5)

## 2021-08-26 ENCOUNTER — Telehealth: Payer: Self-pay

## 2021-08-26 NOTE — Telephone Encounter (Signed)
RCVD fax from [facility] that company has been trying to reach pt to set up home sleep test and to have patient call them at [phone] to get this set up Left pt a voicemail and sent a mychart message regarding this

## 2021-09-08 ENCOUNTER — Ambulatory Visit
Admission: RE | Admit: 2021-09-08 | Discharge: 2021-09-08 | Disposition: A | Discharge status: Home / Self Care | Payer: 59 | Source: Ambulatory Visit | Attending: Orthopedic Surgery | Admitting: Orthopedic Surgery

## 2021-09-08 ENCOUNTER — Other Ambulatory Visit: Payer: Self-pay

## 2021-09-08 DIAGNOSIS — M48062 Spinal stenosis, lumbar region with neurogenic claudication: Secondary | ICD-10-CM

## 2021-09-08 MED ORDER — IOPAMIDOL (ISOVUE-M 300) INJECTION 61%
1.0000 mL | Freq: Once | INTRAMUSCULAR | Status: AC
  Administered 2021-09-08: 10:00:00 1 mL via EPIDURAL

## 2021-09-08 MED ORDER — METHYLPREDNISOLONE ACETATE 40 MG/ML INJ SUSP (RADIOLOG
80.0000 mg | Freq: Once | INTRAMUSCULAR | Status: AC
  Administered 2021-09-08: 10:00:00 80 mg via EPIDURAL

## 2021-09-08 NOTE — Discharge Instructions (Signed)

## 2021-11-15 ENCOUNTER — Ambulatory Visit (AMBULATORY_SURGERY_CENTER): Payer: Self-pay | Admitting: *Deleted

## 2021-11-15 ENCOUNTER — Other Ambulatory Visit: Payer: Self-pay

## 2021-11-15 VITALS — Ht 70.0 in | Wt 195.0 lb

## 2021-11-15 DIAGNOSIS — Z86010 Personal history of colonic polyps: Secondary | ICD-10-CM

## 2021-11-15 MED ORDER — NA SULFATE-K SULFATE-MG SULF 17.5-3.13-1.6 GM/177ML PO SOLN: 1.0000 | Freq: Once | ORAL | 0 refills | Status: AC

## 2021-11-15 NOTE — Progress Notes (Signed)

## 2021-11-30 ENCOUNTER — Encounter: Payer: 59 | Admitting: Internal Medicine

## 2021-12-04 ENCOUNTER — Encounter: Payer: Self-pay | Admitting: Certified Registered Nurse Anesthetist

## 2021-12-07 ENCOUNTER — Encounter: Payer: Self-pay | Admitting: Internal Medicine

## 2021-12-12 ENCOUNTER — Ambulatory Visit (AMBULATORY_SURGERY_CENTER): Payer: 59 | Admitting: Internal Medicine

## 2021-12-12 ENCOUNTER — Encounter: Payer: Self-pay | Admitting: Internal Medicine

## 2021-12-12 ENCOUNTER — Other Ambulatory Visit: Payer: Self-pay

## 2021-12-12 VITALS — BP 114/81 | HR 81 | Temp 99.5°F | Resp 16 | Ht 70.0 in | Wt 195.0 lb

## 2021-12-12 DIAGNOSIS — Z86010 Personal history of colonic polyps: Secondary | ICD-10-CM

## 2021-12-12 MED ORDER — SODIUM CHLORIDE 0.9 % IV SOLN: 500.0000 mL | Freq: Once | INTRAVENOUS | Status: DC

## 2021-12-12 NOTE — Progress Notes (Signed)
Pt's states no medical or surgical changes since previsit or office visit. 

## 2021-12-12 NOTE — Op Note (Signed)
[place] ?Patient Name: Juan Fleming ?Procedure Date: 12/12/2021 1:38 PM ?MRN: [id number] ?Endoscopist: Jerene Bears , MD ?Age: 61 ?Referring MD:  ?Date of Birth: Nov 09, 1960 ?Gender: Male ?Account #: [id number] ?Procedure:                Colonoscopy ?Indications:              High risk colon cancer surveillance: Personal  ?                          history of non-advanced adenoma, Last colonoscopy:  ?                          March 2015 ?Medicines:                Monitored Anesthesia Care ?Procedure:                Pre-Anesthesia Assessment: ?                          - Prior to the procedure, a History and Physical  ?                          was performed, and patient medications and  ?                          allergies were reviewed. The patient's tolerance of  ?                          previous anesthesia was also reviewed. The risks  ?                          and benefits of the procedure and the sedation  ?                          options and risks were discussed with the patient.  ?                          All questions were answered, and informed consent  ?                          was obtained. Prior Anticoagulants: The patient has  ?                          taken no previous anticoagulant or antiplatelet  ?                          agents. ASA Grade Assessment: II - A patient with  ?                          mild systemic disease. After reviewing the risks  ?                          and benefits, the patient was deemed in  ?  satisfactory condition to undergo the procedure. ?                          After obtaining informed consent, the colonoscope  ?                          was passed under direct vision. Throughout the  ?                          procedure, the patient's blood pressure, pulse, and  ?                          oxygen saturations were monitored continuously. The  ?                          Olympus CF-HQ190L ([id number]) Colonoscope was  ?                           introduced through the anus and advanced to the  ?                          cecum, identified by appendiceal orifice and  ?                          ileocecal valve. The colonoscopy was performed  ?                          without difficulty. The patient tolerated the  ?                          procedure well. The quality of the bowel  ?                          preparation was good. The ileocecal valve,  ?                          appendiceal orifice, and rectum were photographed. ?Scope In: 1:42:53 PM ?Scope Out: 1:56:09 PM ?Scope Withdrawal Time: 0 hours 11 minutes 27 seconds  ?Total Procedure Duration: 0 hours 13 minutes 16 seconds  ?Findings:                 The digital rectal exam was normal. ?                          Multiple small-mouthed diverticula were found in  ?                          the sigmoid colon and descending colon. ?                          Internal hemorrhoids were found during  ?                          retroflexion. The hemorrhoids were medium-sized. ?  The exam was otherwise without abnormality. ?Complications:            No immediate complications. ?Estimated Blood Loss:     Estimated blood loss: none. ?Impression:               - Diverticulosis in the sigmoid colon and in the  ?                          descending colon. ?                          - Internal hemorrhoids. ?                          - The examination was otherwise normal. ?                          - No specimens collected. ?Recommendation:           - Patient has a contact number available for  ?                          emergencies. The signs and symptoms of potential  ?                          delayed complications were discussed with the  ?                          patient. Return to normal activities tomorrow.  ?                          Written discharge instructions were provided to the  ?                          patient. ?                          - Resume previous  diet. ?                          - Continue present medications. ?                          - Repeat colonoscopy in 10 years for surveillance. ?Jerene Bears, MD ?12/12/2021 1:59:13 PM ?This report has been signed electronically. ?

## 2021-12-12 NOTE — Patient Instructions (Signed)
Impression/Recommendations: ? ?Diverticulosis and hemorrhoid handouts given to patient. ? ?Resume previous diet. ?Continue present medications. ? ?Repeat colonoscopy in 10 years for surveillance. ? ?YOU HAD AN ENDOSCOPIC PROCEDURE TODAY AT [facility]:   Refer to the procedure report that was given to you for any specific questions about what was found during the examination.  If the procedure report does not answer your questions, please call your gastroenterologist to clarify.  If you requested that your care partner not be given the details of your procedure findings, then the procedure report has been included in a sealed envelope for you to review at your convenience later. ? ?YOU SHOULD EXPECT: Some feelings of bloating in the abdomen. Passage of more gas than usual.  Walking can help get rid of the air that was put into your GI tract during the procedure and reduce the bloating. If you had a lower endoscopy (such as a colonoscopy or flexible sigmoidoscopy) you may notice spotting of blood in your stool or on the toilet paper. If you underwent a bowel prep for your procedure, you may not have a normal bowel movement for a few days. ? ?Please Note:  You might notice some irritation and congestion in your nose or some drainage.  This is from the oxygen used during your procedure.  There is no need for concern and it should clear up in a day or so. ? ?SYMPTOMS TO REPORT IMMEDIATELY: ? ?Following lower endoscopy (colonoscopy or flexible sigmoidoscopy): ? Excessive amounts of blood in the stool ? Significant tenderness or worsening of abdominal pains ? Swelling of the abdomen that is new, acute ? Fever of 100?F or higher ?For urgent or emergent issues, a gastroenterologist can be reached at any hour by calling [phone]. ?Do not use MyChart messaging for urgent concerns.  ? ? ?DIET:  We do recommend a small meal at first, but then you may proceed to your regular diet.  Drink plenty of fluids  but you should avoid alcoholic beverages for 24 hours. ? ?ACTIVITY:  You should plan to take it easy for the rest of today and you should NOT DRIVE or use heavy machinery until tomorrow (because of the sedation medicines used during the test).   ? ?FOLLOW UP: ?Our staff will call the number listed on your records 48-72 hours following your procedure to check on you and address any questions or concerns that you may have regarding the information given to you following your procedure. If we do not reach you, we will leave a message.  We will attempt to reach you two times.  During this call, we will ask if you have developed any symptoms of COVID 19. If you develop any symptoms (ie: fever, flu-like symptoms, shortness of breath, cough etc.) before then, please call [phone].  If you test positive for Covid 19 in the 2 weeks post procedure, please call and report this information to [place].   ? ?If any biopsies were taken you will be contacted by phone or by letter within the next 1-3 weeks.  Please call us at [phone] if you have not heard about the biopsies in 3 weeks.  ? ? ?SIGNATURES/CONFIDENTIALITY: ?You and/or your care partner have signed paperwork which will be entered into your electronic medical record.  These signatures attest to the fact that that the information above on your After Visit Summary has been reviewed and is understood.  Full responsibility of the confidentiality of this discharge information lies with  you and/or your care-partner.  ?

## 2021-12-12 NOTE — Progress Notes (Signed)
? ?GASTROENTEROLOGY PROCEDURE H&P NOTE  ? ?Primary Care Physician: ?Tysinger, Camelia Eng, PA-C ? ? ? ?Reason for Procedure:  History of adenomatous colon polyp ? ?Plan:    Colonoscopy ? ?Patient is appropriate for endoscopic procedure(s) in the ambulatory ([facility]) setting. ? ?The nature of the procedure, as well as the risks, benefits, and alternatives were carefully and thoroughly reviewed with the patient. Ample time for discussion and questions allowed. The patient understood, was satisfied, and agreed to proceed.  ? ? ? ?HPI: ?Juan Fleming is a 62 y.o. male who presents for surveillance colonoscopy.  Medical history as below.  Tolerated the prep.  No recent chest pain or shortness of breath.  No abdominal pain today. ? ?Past Medical History:  ?Diagnosis Date  ? ED (erectile dysfunction) 2008  ? Hyperlipidemia   ? Keloid of skin   ? Neuromuscular disorder ([place])   ? Neuropathy   ? Status post LASIK surgery   ? ? ?Past Surgical History:  ?Procedure Laterality Date  ? COLONOSCOPY  11/16/2013  ? tubular adenoma, Dr. Ulice Dash Rayen Dafoe  ? EPIDURAL BLOCK INJECTION    ? TO BACK 3 MONTHS AGO  ? LASIK Bilateral 09/18/2006  ? POLYPECTOMY    ? ? ?Prior to Admission medications   ?Medication Sig Start Date End Date Taking? Authorizing Provider  ?gabapentin (NEURONTIN) 300 MG capsule Take 1 capsule (300 mg total) by mouth 2 (two) times daily. ?Patient not taking: Reported on 11/15/2021 08/09/21   Carlena Hurl, PA-C  ?HYDROcodone-acetaminophen (NORCO) 5-325 MG tablet Take 1 tablet by mouth every 12 (twelve) hours as needed. ?Patient not taking: Reported on 11/15/2021 08/09/21   Carlena Hurl, PA-C  ?Multiple Vitamin (MULTIVITAMIN PO) Take by mouth daily. TAKE CHEWABLE 1-2    [provider]  ?OVER THE COUNTER MEDICATION daily. PUT FEW DROOPS IN LIQUID    [provider]  ?pravastatin (PRAVACHOL) 20 MG tablet TAKE 1 TABLET(20 MG) BY MOUTH EVERY EVENING ?Patient not taking: Reported on 07/13/2021 06/28/20    Tysinger, Camelia Eng, PA-C  ?sildenafil (VIAGRA) 100 MG tablet TAKE 1/2 TO 1 TABLET BY MOUTH ONCE DAILY AS NEEDED ?Patient not taking: Reported on 11/15/2021 08/09/21   Carlena Hurl, PA-C  ? ? ?Current Outpatient Medications  ?Medication Sig Dispense Refill  ? gabapentin (NEURONTIN) 300 MG capsule Take 1 capsule (300 mg total) by mouth 2 (two) times daily. (Patient not taking: Reported on 11/15/2021) 60 capsule 2  ? HYDROcodone-acetaminophen (NORCO) 5-325 MG tablet Take 1 tablet by mouth every 12 (twelve) hours as needed. (Patient not taking: Reported on 11/15/2021) 30 tablet 0  ? Multiple Vitamin (MULTIVITAMIN PO) Take by mouth daily. TAKE CHEWABLE 1-2    ? OVER THE COUNTER MEDICATION daily. PUT FEW DROOPS IN LIQUID    ? pravastatin (PRAVACHOL) 20 MG tablet TAKE 1 TABLET(20 MG) BY MOUTH EVERY EVENING (Patient not taking: Reported on 07/13/2021) 30 tablet 0  ? sildenafil (VIAGRA) 100 MG tablet TAKE 1/2 TO 1 TABLET BY MOUTH ONCE DAILY AS NEEDED (Patient not taking: Reported on 11/15/2021) 10 tablet 2  ? ?Current Facility-Administered Medications  ?Medication Dose Route Frequency Provider Last Rate Last Admin  ? 0.9 %  sodium chloride infusion  500 mL Intravenous Once Orlo Brickle, Lajuan Lines, MD      ? ? ?Allergies as of 12/12/2021  ? (No Known Allergies)  ? ? ?Family History  ?Problem Relation Age of Onset  ? Diabetes Mother   ? Hypertension Father   ? Thyroid disease  Sister   ? Arthritis Brother   ? Stroke Neg Hx   ? Heart disease Neg Hx   ? Hyperlipidemia Neg Hx   ? Cancer Neg Hx   ? Colon cancer Neg Hx   ? Rectal cancer Neg Hx   ? Stomach cancer Neg Hx   ? Colon polyps Neg Hx   ? ? ?Social History  ? ?Socioeconomic History  ? Marital status: Married  ?  Spouse name: Not on file  ? Number of children: Not on file  ? Years of education: Not on file  ? Highest education level: Not on file  ?Occupational History  ? Occupation: maintenance  ?  Employer: [employer]  ?Tobacco Use  ? Smoking status: Never  ?  Passive exposure:  Never  ? Smokeless tobacco: Never  ?Vaping Use  ? Vaping Use: Never used  ?Substance and Sexual Activity  ? Alcohol use: No  ?  Alcohol/week: 0.0 standard drinks  ?  Comment: occasional on a holiday  ? Drug use: No  ? Sexual activity: Yes  ?Other Topics Concern  ? Not on file  ?Social History Narrative  ? Married, 2 children.  Works at [employer], [employer].   07/2021  ? ?Social Determinants of Health  ? ?Financial Resource Strain: Not on file  ?Food Insecurity: Not on file  ?Transportation Needs: Not on file  ?Physical Activity: Not on file  ?Stress: Not on file  ?Social Connections: Not on file  ?Intimate Partner Violence: Not on file  ? ? ?Physical Exam: ?Vital signs in last 24 hours: ?'@BP'$  (!) 156/108   Pulse 91   Temp 99.5 ?F (37.5 ?C) (Temporal)   Resp 20   Ht '5\' 10"'$  (1.778 m)   Wt 195 lb (88.5 kg)   SpO2 100%   BMI 27.98 kg/m?  ?GEN: NAD ?EYE: Sclerae anicteric ?ENT: MMM ?CV: Non-tachycardic ?Pulm: CTA b/l ?GI: Soft, NT/ND ?NEURO:  Alert & Oriented x 3 ? ? ?Zenovia Jarred, MD ?[facility] ? ?12/12/2021 1:41 PM ? ?

## 2021-12-14 ENCOUNTER — Telehealth: Payer: Self-pay

## 2021-12-14 ENCOUNTER — Telehealth: Payer: Self-pay | Admitting: *Deleted

## 2021-12-14 NOTE — Telephone Encounter (Signed)
No answer on first attempt follow up call. Left message.  ?

## 2021-12-14 NOTE — Telephone Encounter (Signed)
?  Follow up Call- ? ? ?  12/12/2021  ?  1:18 PM  ?Call back number  ?Post procedure Call Back phone  # [phone]  ?Permission to leave phone message Yes  ?  ? ?Patient questions: ? ?Do you have a fever, pain , or abdominal swelling? No. ?Pain Score  0 * ? ?Have you tolerated food without any problems? yes ? ?Have you been able to return to your normal activities? yes ? ?Do you have any questions about your discharge instructions: ?Diet   No. ?Medications  No. ?Follow up visit  No. ? ?Do you have questions or concerns about your Care? No. ? ?Actions: ?* If pain score is 4 or above: ?No action needed, pain <4. ? ? ?

## 2022-05-24 ENCOUNTER — Encounter: Payer: Self-pay | Admitting: Internal Medicine

## 2022-06-13 ENCOUNTER — Ambulatory Visit: Payer: 59 | Admitting: Family Medicine

## 2022-06-13 VITALS — BP 130/80 | HR 95 | Temp 97.7°F | Wt 194.2 lb

## 2022-06-13 DIAGNOSIS — S40861A Insect bite (nonvenomous) of right upper arm, initial encounter: Secondary | ICD-10-CM

## 2022-06-13 DIAGNOSIS — N529 Male erectile dysfunction, unspecified: Secondary | ICD-10-CM | POA: Diagnosis not present

## 2022-06-13 DIAGNOSIS — W57XXXA Bitten or stung by nonvenomous insect and other nonvenomous arthropods, initial encounter: Secondary | ICD-10-CM

## 2022-06-13 MED ORDER — SILDENAFIL CITRATE 100 MG PO TABS
ORAL_TABLET | ORAL | 2 refills | Status: DC
Start: 2022-06-13 — End: 2023-01-16

## 2022-06-13 NOTE — Progress Notes (Signed)
   Subjective:    Patient ID: Juan Fleming, male    DOB: [birth year], 61 y.o.   MRN: [id number]  HPI He states that he got bit by an insect approximately 1 week ago to the right arm area.  He does note some slight swelling but no erythema warmth or tenderness. He would also like Viagra renewed.  Review of Systems     Objective:   Physical Exam Exam of the right arm does show a lateral lesion that is healing but no warmth tenderness or drainage.       Assessment & Plan:  Insect bite of right upper arm, initial encounter  Erectile dysfunction, unspecified erectile dysfunction type I explained that at this time I see nothing to worry about and I would treat it symptomatically.  He was comfortable with that.

## 2022-06-27 ENCOUNTER — Encounter: Payer: Self-pay | Admitting: Internal Medicine

## 2022-08-15 ENCOUNTER — Encounter: Payer: Self-pay | Admitting: Medical

## 2022-08-15 ENCOUNTER — Ambulatory Visit: Payer: 59 | Admitting: Medical

## 2022-08-15 VITALS — BP 110/68 | HR 95 | Ht 70.0 in | Wt 199.2 lb

## 2022-08-15 DIAGNOSIS — Z23 Encounter for immunization: Secondary | ICD-10-CM | POA: Diagnosis not present

## 2022-08-15 DIAGNOSIS — Z0000 Encounter for general adult medical examination without abnormal findings: Secondary | ICD-10-CM

## 2022-08-15 DIAGNOSIS — Z125 Encounter for screening for malignant neoplasm of prostate: Secondary | ICD-10-CM

## 2022-08-15 DIAGNOSIS — Z136 Encounter for screening for cardiovascular disorders: Secondary | ICD-10-CM | POA: Diagnosis not present

## 2022-08-15 DIAGNOSIS — E785 Hyperlipidemia, unspecified: Secondary | ICD-10-CM

## 2022-08-15 LAB — POCT URINALYSIS DIP (PROADVANTAGE DEVICE)
Bilirubin, UA: NEGATIVE
Blood, UA: NEGATIVE
Glucose, UA: NEGATIVE mg/dL
Ketones, POC UA: NEGATIVE mg/dL
Leukocytes, UA: NEGATIVE
Nitrite, UA: NEGATIVE
Protein Ur, POC: NEGATIVE mg/dL
Specific Gravity, Urine: 1.01
Urobilinogen, Ur: NEGATIVE
pH, UA: 6 (ref 5.0–8.0)

## 2022-08-15 MED ORDER — GABAPENTIN 300 MG PO CAPS: 300.0000 mg | ORAL_CAPSULE | Freq: Two times a day (BID) | ORAL | 2 refills | Status: DC

## 2022-08-15 NOTE — Progress Notes (Signed)
Subjective: Chief Complaint  Patient presents with   fasting cpe    Fasting cpe, some right foot pain, declines flu and covid. Undecided on shingrix     Here for physical  Medical team: Dr. Zenovia Jarred, GI Dentist Eye doctor Dr. Boyd Kerbs, neurology Dr. Phylliss Bob, orthopedics Kalkidan Caudell, Camelia Eng, PA-C here for primary care  Concerns: He is interested in the shingles vaccine  He notes ongoing issues with neuropathy and numbness in right lower leg and foot .  Had worse back issues last years but not as much back issues of late.  Sees orthopedics.  Uses steel toe boots daily, walks about 12000 steps daily.    Sometimes left great toenail hurts.  Not currently compliant with statin   Reviewed their medical, surgical, family, social, medication, and allergy history and updated chart as appropriate.  Past Medical History:  Diagnosis Date   ED (erectile dysfunction) 2008   Hyperlipidemia    Keloid of skin    Neuromuscular disorder ([place])    Neuropathy    Status post LASIK surgery     Past Surgical History:  Procedure Laterality Date   COLONOSCOPY  11/16/2013   tubular adenoma, Dr. Zenovia Jarred   EPIDURAL BLOCK INJECTION     TO BACK 3 MONTHS AGO   LASIK Bilateral 09/18/2006   POLYPECTOMY      Social History   Socioeconomic History   Marital status: Married    Spouse name: Not on file   Number of children: Not on file   Years of education: Not on file   Highest education level: Not on file  Occupational History   Occupation: maintenance    Employer: [employer]  Tobacco Use   Smoking status: Never    Passive exposure: Never   Smokeless tobacco: Never  Vaping Use   Vaping Use: Never used  Substance and Sexual Activity   Alcohol use: No    Alcohol/week: 0.0 standard drinks of alcohol    Comment: occasional on a holiday   Drug use: No   Sexual activity: Yes  Other Topics Concern   Not on file  Social History Narrative   Married, 2 children.   Works at [employer], [employer].   07/2022   Social Determinants of Health   Financial Resource Strain: Not on file  Food Insecurity: Not on file  Transportation Needs: Not on file  Physical Activity: Not on file  Stress: Not on file  Social Connections: Not on file  Intimate Partner Violence: Not on file    Family History  Problem Relation Age of Onset   Diabetes Mother    Hypertension Father    Thyroid disease Sister    Arthritis Brother    Stroke Neg Hx    Heart disease Neg Hx    Hyperlipidemia Neg Hx    Cancer Neg Hx    Colon cancer Neg Hx    Rectal cancer Neg Hx    Stomach cancer Neg Hx    Colon polyps Neg Hx      Current Outpatient Medications:    Multiple Vitamin (MULTIVITAMIN PO), Take by mouth daily. TAKE CHEWABLE 1-2, Disp: , Rfl:    sildenafil (VIAGRA) 100 MG tablet, TAKE 1/2 TO 1 TABLET BY MOUTH ONCE DAILY AS NEEDED, Disp: 10 tablet, Rfl: 2   gabapentin (NEURONTIN) 300 MG capsule, Take 1 capsule (300 mg total) by mouth 2 (two) times daily., Disp: 60 capsule, Rfl: 2   pravastatin (PRAVACHOL) 20 MG tablet, TAKE 1 TABLET(20 MG)  BY MOUTH EVERY EVENING (Patient not taking: Reported on 07/13/2021), Disp: 30 tablet, Rfl: 0  No Known Allergies   Review of Systems Constitutional: -fever, -chills, -sweats, -unexpected weight change, -decreased appetite, -fatigue Allergy: -sneezing, -itching, -congestion Dermatology: -changing moles, --rash, -lumps ENT: -runny nose, -ear pain, -sore throat, -hoarseness, -sinus pain, -teeth pain, - ringing in ears, -hearing loss, -nosebleeds Cardiology: -chest pain, -palpitations, -swelling, -difficulty breathing when lying flat, -waking up short of breath Respiratory: -cough, -shortness of breath, -difficulty breathing with exercise or exertion, -wheezing, -coughing up blood Gastroenterology: -abdominal pain, -nausea, -vomiting, -diarrhea, -constipation, -blood in stool, -changes in bowel movement, -difficulty swallowing or  eating Hematology: -bleeding, -bruising  Musculoskeletal: +joint aches, -muscle aches, -joint swelling, -back pain, -neck pain, -cramping, -changes in gait Ophthalmology: denies vision changes, eye redness, itching, discharge Urology: -burning with urination, -difficulty urinating, -blood in urine, -urinary frequency, -urgency, -incontinence Neurology: -headache, +leg weakness, -tingling, +numbness, -memory loss, -falls, -dizziness Psychology: -depressed mood, -agitation, -sleep problems Male GU: no testicular mass, pain, no lymph nodes swollen, no swelling, no rash.     Objective:  BP 110/68   Pulse 95   Ht '5\' 10"'$  (1.778 m)   Wt 199 lb 3.2 oz (90.4 kg)   BMI 28.58 kg/m   General appearance: alert, no distress, WD/WN, African American male Skin: skin unremarkable HEENT: normocephalic, conjunctiva/corneas normal, sclerae anicteric, PERRLA, EOMi, nares patent, no discharge or erythema, pharynx normal Oral cavity: MMM, tongue normal, teeth in good repair Neck: supple, no lymphadenopathy, no thyromegaly, no masses, normal ROM, no bruits Chest: non tender, normal shape and expansion Heart: RRR, normal S1, S2, no murmurs Lungs: CTA bilaterally, no wheezes, rhonchi, or rales Abdomen: +bs, soft, non tender, non distended, no masses, no hepatomegaly, no splenomegaly, no bruits Back: nontender,good ROM, no scoliosis Musculoskeletal: right upper lateral thigh with linear 6cm indention chronic,  upper extremities non tender, no obvious deformity, normal ROM throughout, lower extremities non tender, no obvious deformity, normal ROM throughout, legs nontender with relatively normal ROM Extremities: no edema, no cyanosis, no clubbing Pulses: 2+ symmetric, upper and 1+ lower extremities, normal cap refill Neurological: strength of LE normal but sensation blunted of right lower leg and foot completed to right, but still able to feel monofilament and discretion, otherwise  no obvious weakness, alert,  oriented x 3, CN2-12 intact, strength normal upper extremities and lower extremities, sensation normal throughout, DTRs 2+ throughout, no cerebellar signs, gait normal Psychiatric: normal affect, behavior normal, pleasant  GU: normal male external genitalia,circumcised, nontender, no masses, no hernia, no lymphadenopathy Rectal: deferred    MRI lumbar spine 08/08/21  IMPRESSION: 1. Marked congenital stenosis of the lumbar canal with superimposed degenerative changes as described. 2. Moderate to severe spinal canal stenosis at L2-L3 with impingement of the traversing left L3 nerve root in the subarticular zone. 3. Severe spinal canal stenosis at L3-L4 with possible mass effect on either traversing nerve root, and moderate left neural foraminal stenosis. 4. Severe spinal canal stenosis with impingement of the traversing cauda equina nerve roots and moderate bilateral neural foraminal stenosis at L4-L5. 5. Disc bulge with superimposed left subarticular zone protrusion at L5-S1 resulting in impingement of the traversing left S1 nerve root. There is severe bilateral neural foraminal stenosis at this level. 6. Multilevel facet arthropathy, most advanced at L4-L5, with prominent perifacetal soft tissue edema on the right at L4-L5.       Assessment and Plan :   Encounter Diagnoses  Name Primary?   Encounter for health maintenance  examination in adult Yes   Screening for heart disease    Hyperlipidemia, unspecified hyperlipidemia type    Need for shingles vaccine    Screening for prostate cancer     This visit was a preventative care visit, also known as wellness visit or routine physical.   Topics typically include healthy lifestyle, diet, exercise, preventative care, vaccinations, sick and well care, proper use of emergency dept and after hours care, as well as other concerns.     Recommendations: Continue to return yearly for your annual wellness and preventative care visits.   This gives [place] a chance to discuss healthy lifestyle, exercise, vaccinations, review your chart record, and perform screenings where appropriate.  I recommend you see your eye doctor yearly for routine vision care.  I recommend you see your dentist yearly for routine dental care including hygiene visits twice yearly.   Vaccination recommendations were reviewed Immunization History  Administered Date(s) Administered   Tdap 10/01/2013   Zoster Recombinat (Shingrix) 08/15/2022   Counseled on the Shingrix vaccine.  Vaccine information sheet given. Shingrix #1 vaccine given after consent obtained.   Return in 2 months for Shingrix #2.   Screening for cancer: Colon cancer screening: Reviewed 11/2021 colonoscopy from Dr. Luiz Blare  We discussed PSA, prostate exam, and prostate cancer screening risks/benefits.     Skin cancer screening: Check your skin regularly for new changes, growing lesions, or other lesions of concern Come in for evaluation if you have skin lesions of concern.  Lung cancer screening: If you have a greater than 20 pack year history of tobacco use, then you may qualify for lung cancer screening with a chest CT scan.   Please call your insurance company to inquire about coverage for this test.  We currently don't have screenings for other cancers besides breast, cervical, colon, and lung cancers.  If you have a strong family history of cancer or have other cancer screening concerns, please let me know.    Bone health: Get at least 150 minutes of aerobic exercise weekly Get weight bearing exercise at least once weekly Bone density test:  A bone density test is an imaging test that uses a type of X-ray to measure the amount of calcium and other minerals in your bones. The test may be used to diagnose or screen you for a condition that causes weak or thin bones (osteoporosis), predict your risk for a broken bone (fracture), or determine how well your osteoporosis treatment is  working. The bone density test is recommended for females 66 and older, or females or males <40 if certain risk factors such as thyroid disease, long term use of steroids such as for asthma or rheumatological issues, vitamin D deficiency, estrogen deficiency, family history of osteoporosis, self or family history of fragility fracture in first degree relative.    Heart health: Get at least 150 minutes of aerobic exercise weekly Limit alcohol It is important to maintain a healthy blood pressure and healthy cholesterol numbers  Heart disease screening: Screening for heart disease includes screening for blood pressure, fasting lipids, glucose/diabetes screening, BMI height to weight ratio, reviewed of smoking status, physical activity, and diet.    Goals include blood pressure 120/80 or less, maintaining a healthy lipid/cholesterol profile, preventing diabetes or keeping diabetes numbers under good control, not smoking or using tobacco products, exercising most days per week or at least 150 minutes per week of exercise, and eating healthy variety of fruits and vegetables, healthy oils, and avoiding unhealthy food choices  like fried food, fast food, high sugar and high cholesterol foods.    Other tests may possibly include EKG test, CT coronary calcium score, echocardiogram, exercise treadmill stress test.     Medical care options: I recommend you continue to seek care here first for routine care.  We try really hard to have available appointments Monday through Friday daytime hours for sick visits, acute visits, and physicals.  Urgent care should be used for after hours and weekends for significant issues that cannot wait till the next day.  The emergency department should be used for significant potentially life-threatening emergencies.  The emergency department is expensive, can often have long wait times for less significant concerns, so try to utilize primary care, urgent care, or telemedicine  when possible to avoid unnecessary trips to the emergency department.  Virtual visits and telemedicine have been introduced since the pandemic started in 2020, and can be convenient ways to receive medical care.  We offer virtual appointments as well to assist you in a variety of options to seek medical care.    Separate significant issues discussed: chronic back pain, spinal stenosis, leg neuropathy - restart Gabapentin, consider f/u with orthopedics.   hyperlipidemia -noncompliant, updated labs today  ED -discussed proper use of Viagra, risk and benefits    [place] was seen today for fasting cpe.  Diagnoses and all orders for this visit:  Encounter for health maintenance examination in adult -     Comprehensive metabolic panel -     CBC -     PSA -     Lipid panel -     POCT Urinalysis DIP (Proadvantage Device) -     CT CARDIAC SCORING ([facility] LOCATIONS ONLY); Future  Screening for heart disease -     CT CARDIAC SCORING ([facility] LOCATIONS ONLY); Future  Hyperlipidemia, unspecified hyperlipidemia type -     Lipid panel  Need for shingles vaccine -     Varicella-zoster vaccine IM  Screening for prostate cancer -     PSA  Other orders -     gabapentin (NEURONTIN) 300 MG capsule; Take 1 capsule (300 mg total) by mouth 2 (two) times daily.   Follow-up pending labs, yearly for physical

## 2022-08-16 LAB — COMPREHENSIVE METABOLIC PANEL
ALT: 19 IU/L (ref 0–44)
AST: 18 IU/L (ref 0–40)
Albumin/Globulin Ratio: 1.7 (ref 1.2–2.2)
Albumin: 4.2 g/dL (ref 3.9–4.9)
Alkaline Phosphatase: 98 IU/L (ref 44–121)
BUN/Creatinine Ratio: 11 (ref 10–24)
BUN: 14 mg/dL (ref 8–27)
Bilirubin Total: 0.4 mg/dL (ref 0.0–1.2)
CO2: 25 mmol/L (ref 20–29)
Calcium: 10.1 mg/dL (ref 8.6–10.2)
Chloride: 103 mmol/L (ref 96–106)
Creatinine, Ser: 1.26 mg/dL (ref 0.76–1.27)
Globulin, Total: 2.5 g/dL (ref 1.5–4.5)
Glucose: 95 mg/dL (ref 70–99)
Potassium: 5 mmol/L (ref 3.5–5.2)
Sodium: 142 mmol/L (ref 134–144)
Total Protein: 6.7 g/dL (ref 6.0–8.5)
eGFR: 65 mL/min/{1.73_m2} (ref >59)

## 2022-08-16 LAB — CBC
Hematocrit: 48 % (ref 37.5–51.0)
Hemoglobin: 15.7 g/dL (ref 13.0–17.7)
MCH: 27.6 pg (ref 26.6–33.0)
MCHC: 32.7 g/dL (ref 31.5–35.7)
MCV: 84 fL (ref 79–97)
Platelets: 288 10*3/uL (ref 150–450)
RBC: 5.69 x10E6/uL (ref 4.14–5.80)
RDW: 13.2 % (ref 11.6–15.4)
WBC: 5.2 10*3/uL (ref 3.4–10.8)

## 2022-08-16 LAB — LIPID PANEL
Chol/HDL Ratio: 4.2 ratio (ref 0.0–5.0)
Cholesterol, Total: 225 mg/dL — ABNORMAL HIGH (ref 100–199)
HDL: 53 mg/dL (ref >39)
LDL Chol Calc (NIH): 152 mg/dL — ABNORMAL HIGH (ref 0–99)
Triglycerides: 111 mg/dL (ref 0–149)
VLDL Cholesterol Cal: 20 mg/dL (ref 5–40)

## 2022-08-16 LAB — PSA: Prostate Specific Ag, Serum: 2.2 ng/mL (ref 0.0–4.0)

## 2022-10-17 ENCOUNTER — Other Ambulatory Visit (INDEPENDENT_AMBULATORY_CARE_PROVIDER_SITE_OTHER): Payer: 59

## 2022-10-17 DIAGNOSIS — Z23 Encounter for immunization: Secondary | ICD-10-CM | POA: Diagnosis not present

## 2023-01-02 ENCOUNTER — Other Ambulatory Visit (HOSPITAL_COMMUNITY): Payer: Self-pay

## 2023-01-02 MED ORDER — TOPIRAMATE 25 MG PO TABS
25.0000 mg | ORAL_TABLET | Freq: Every evening | ORAL | 0 refills | Status: DC
  Filled 2023-01-02 – 2023-01-08 (×2): qty 30, 30d supply, fill #0

## 2023-01-08 ENCOUNTER — Other Ambulatory Visit: Payer: Self-pay

## 2023-01-08 ENCOUNTER — Other Ambulatory Visit (HOSPITAL_COMMUNITY): Payer: Self-pay

## 2023-01-16 ENCOUNTER — Telehealth: Payer: Self-pay | Admitting: Medical

## 2023-01-16 ENCOUNTER — Other Ambulatory Visit: Payer: Self-pay | Admitting: Medical

## 2023-01-16 MED ORDER — SILDENAFIL CITRATE 100 MG PO TABS: ORAL_TABLET | ORAL | 3 refills | Status: DC

## 2023-01-16 NOTE — Telephone Encounter (Signed)
Pt left message he would like Cialis or substitute sent it

## 2023-01-22 ENCOUNTER — Other Ambulatory Visit (HOSPITAL_COMMUNITY): Payer: Self-pay

## 2023-01-22 MED ORDER — TOPIRAMATE 25 MG PO TABS
50.0000 mg | ORAL_TABLET | Freq: Every evening | ORAL | 0 refills | Status: DC
  Filled 2023-01-22 (×2): qty 60, 30d supply, fill #0

## 2023-02-22 ENCOUNTER — Other Ambulatory Visit (HOSPITAL_COMMUNITY): Payer: Self-pay

## 2023-02-22 ENCOUNTER — Other Ambulatory Visit: Payer: Self-pay

## 2023-02-22 MED ORDER — PHENTERMINE HCL 37.5 MG PO TABS
18.7500 mg | ORAL_TABLET | Freq: Every day | ORAL | 0 refills | Status: DC
  Filled 2023-02-22: qty 5, 10d supply, fill #0

## 2023-03-02 ENCOUNTER — Other Ambulatory Visit: Payer: Self-pay

## 2023-03-02 ENCOUNTER — Other Ambulatory Visit (HOSPITAL_COMMUNITY): Payer: Self-pay

## 2023-03-02 MED ORDER — PHENTERMINE HCL 37.5 MG PO TABS
18.7500 mg | ORAL_TABLET | Freq: Every morning | ORAL | 0 refills | Status: DC
  Filled 2023-03-02: qty 15, 30d supply, fill #0

## 2023-03-05 ENCOUNTER — Other Ambulatory Visit: Payer: Self-pay

## 2023-03-07 ENCOUNTER — Other Ambulatory Visit: Payer: Self-pay

## 2023-05-08 ENCOUNTER — Other Ambulatory Visit (HOSPITAL_COMMUNITY): Payer: Self-pay

## 2023-05-08 MED ORDER — TOPIRAMATE 25 MG PO TABS
ORAL_TABLET | ORAL | 0 refills | Status: DC
  Filled 2023-05-08: qty 60, 33d supply, fill #0

## 2023-05-08 MED ORDER — PHENTERMINE HCL 37.5 MG PO TABS
18.7500 mg | ORAL_TABLET | Freq: Every day | ORAL | 0 refills | Status: DC
  Filled 2023-05-08: qty 15, 30d supply, fill #0

## 2023-05-30 ENCOUNTER — Other Ambulatory Visit: Payer: Self-pay

## 2023-05-30 ENCOUNTER — Encounter (HOSPITAL_COMMUNITY): Payer: Self-pay

## 2023-05-30 ENCOUNTER — Other Ambulatory Visit (HOSPITAL_COMMUNITY): Payer: Self-pay

## 2023-06-01 ENCOUNTER — Other Ambulatory Visit: Payer: Self-pay

## 2023-06-05 ENCOUNTER — Ambulatory Visit: Payer: 59 | Admitting: Medical

## 2023-06-05 VITALS — BP 122/72 | HR 83 | Temp 98.3°F | Wt 189.8 lb

## 2023-06-05 DIAGNOSIS — R202 Paresthesia of skin: Secondary | ICD-10-CM | POA: Insufficient documentation

## 2023-06-05 DIAGNOSIS — M48062 Spinal stenosis, lumbar region with neurogenic claudication: Secondary | ICD-10-CM | POA: Insufficient documentation

## 2023-06-05 DIAGNOSIS — M47819 Spondylosis without myelopathy or radiculopathy, site unspecified: Secondary | ICD-10-CM | POA: Diagnosis not present

## 2023-06-05 DIAGNOSIS — M5441 Lumbago with sciatica, right side: Secondary | ICD-10-CM

## 2023-06-05 DIAGNOSIS — R200 Anesthesia of skin: Secondary | ICD-10-CM

## 2023-06-05 DIAGNOSIS — M5136 Other intervertebral disc degeneration, lumbar region: Secondary | ICD-10-CM | POA: Insufficient documentation

## 2023-06-05 DIAGNOSIS — G8929 Other chronic pain: Secondary | ICD-10-CM | POA: Insufficient documentation

## 2023-06-05 DIAGNOSIS — J0100 Acute maxillary sinusitis, unspecified: Secondary | ICD-10-CM | POA: Diagnosis not present

## 2023-06-05 MED ORDER — PREDNISONE 10 MG PO TABS: ORAL_TABLET | ORAL | 0 refills | Status: DC

## 2023-06-05 MED ORDER — AMOXICILLIN-POT CLAVULANATE 875-125 MG PO TABS: 1.0000 | ORAL_TABLET | Freq: Two times a day (BID) | ORAL | 0 refills | Status: DC

## 2023-06-05 MED ORDER — GABAPENTIN 300 MG PO CAPS: 300.0000 mg | ORAL_CAPSULE | Freq: Two times a day (BID) | ORAL | 0 refills | Status: DC

## 2023-06-05 NOTE — Progress Notes (Addendum)
Subjective:  Juan Fleming is a 62 y.o. male who presents for Chief Complaint  Patient presents with   Back Pain    Back pain x long time- spasms, right foot pain- numb- pain sometimes, sinuses- chronic issue     Here for ongoing problems with right lower back pain, pain down right leg, can't feel right foot well.  Wears a more comfortable shoe when not at work.  Can't really feel his right foot at times.  This interferes with driving as well.  No recent injury or trauma.  In the past he has used a handicap placard given his limitations.  Lately has had more problems walking in the work.  Feels like he may need to cut back on work hours due to the pain getting worse.  He has dealt with this for several years but it seems to be worse in recent months  He has pain in the low back on the right, pain down the right leg, numbness and tingling in the right foot.  He has had a few falls in the last year due to this issue.  He also has concern for sinus infection.  He is having to clear phlegm, having some drainage, feels pressure in his sinuses, thick mucus.  This has been going on for couple weeks.  No other aggravating or relieving factors.    No other c/o.   Past Medical History:  Diagnosis Date   ED (erectile dysfunction) 2008   Hyperlipidemia    Keloid of skin    Neuromuscular disorder ([facility])    Neuropathy    Status post LASIK surgery    Current Outpatient Medications on File Prior to Visit  Medication Sig Dispense Refill   Multiple Vitamin (MULTIVITAMIN PO) Take by mouth daily. TAKE CHEWABLE 1-2     pravastatin (PRAVACHOL) 20 MG tablet Take 20 mg by mouth daily.     sildenafil (VIAGRA) 100 MG tablet TAKE 1/2 TO 1 TABLET BY MOUTH ONCE DAILY AS NEEDED 10 tablet 3   No current facility-administered medications on file prior to visit.     The following portions of the patient's history were reviewed and updated as appropriate: allergies, current medications, past family history, past  medical history, past social history, past surgical history and problem list.  ROS Otherwise as in subjective above  Objective: BP 122/72   Pulse 83   Temp 98.3 F (36.8 C)   Wt 189 lb 12.8 oz (86.1 kg)   BMI 27.23 kg/m   General appearance: alert, no distress, well developed, well nourished HEENT: normocephalic, sclerae anicteric, conjunctiva pink and moist, TMs pearly, nares with some mucoid discharge, positive erythema, pharynx normal Oral cavity: MMM, no lesions Neck: supple, no lymphadenopathy, no thyromegaly, no masses Heart: RRR, normal S1, S2, no murmurs Lungs: CTA bilaterally, no wheezes, rhonchi, or rales Mild tenderness of the right lumbar spine paraspinal region, otherwise nontender, no obvious scoliosis, range of motion somewhat decreased due to pain Legs nontender without deformity, mild pain with straight leg raise on the right 40 degrees Unable to toe walk but can do heel walk.  Right foot strength decreased compared to the left, otherwise leg strength okay.  DTRs normal, decreased sensation of the right foot volar toes otherwise seemingly normal sensation Pulses: 2+ radial pulses, 2+ pedal pulses, normal cap refill Ext: no edema   Assessment: Encounter Diagnoses  Name Primary?   Acute non-recurrent maxillary sinusitis Yes   Spinal stenosis of lumbar region with neurogenic claudication  Facet arthropathy    Bulging lumbar disc    Numbness and tingling of foot    Chronic right-sided low back pain with right-sided sciatica      Plan: Sinusitis Begin Mucinex or Sudafed twice daily for the next 5 to 7 days Increase water intake, make sure you drink at least 100 ounces of water daily Consider nasal saline flush this week daily Begin Augmentin antibiotic If not much improved within the next week, then let me know  Spinal stenosis, facet arthropathy, bulging disc of the lumbar spine, chronic, numbness complaining of right foot Begin short-term prednisone  steroid given your flareup Begin back on gabapentin twice daily to help with the symptoms I am referring you back to Dr. Yevette Edwards for follow-up visit as you may need to revisit either epidural steroid injection or possible surgery You can do some stretching in the meantime No heavy lifting currently If you are working regularly I would make sure you are working no more than 8-hour days   Handicap placard form for temporary card completed today  [name] was seen today for back pain.  Diagnoses and all orders for this visit:  Acute non-recurrent maxillary sinusitis  Spinal stenosis of lumbar region with neurogenic claudication  Facet arthropathy  Bulging lumbar disc  Numbness and tingling of foot  Chronic right-sided low back pain with right-sided sciatica  Other orders -     amoxicillin-clavulanate (AUGMENTIN) 875-125 MG tablet; Take 1 tablet by mouth 2 (two) times daily. -     gabapentin (NEURONTIN) 300 MG capsule; Take 1 capsule (300 mg total) by mouth 2 (two) times daily. -     predniSONE (DELTASONE) 10 MG tablet; 6 tablets all together day 1, 5 tablets day 2, 4 tablets day 3, 3 tablets day 4, 2 tablets day 5, 1 tablet day 6.    Follow up: with ortho

## 2023-06-05 NOTE — Patient Instructions (Signed)
Sinusitis Begin Mucinex or Sudafed twice daily for the next 5 to 7 days Increase water intake, make sure you drink at least 100 ounces of water daily Consider nasal saline flush this week daily Begin Augmentin antibiotic If not much improved within the next week, then let me know  Spinal stenosis, facet arthropathy, bulging disc of the lumbar spine, chronic, numbness complaining of right foot Begin short-term prednisone steroid given your flareup Begin back on gabapentin twice daily to help with the symptoms I am referring you back to Dr. Yevette Edwards for follow-up visit as you may need to revisit either epidural steroid injection or possible surgery You can do some stretching in the meantime No heavy lifting currently If you are working regularly I would make sure you are working no more than 8-hour days

## 2023-06-06 ENCOUNTER — Encounter: Payer: Self-pay | Admitting: Internal Medicine

## 2023-06-06 NOTE — Progress Notes (Signed)
Pt is scheduled on 06/12/23 @9 :15am with Dr. Vale Haven. Pt is aware

## 2023-06-11 ENCOUNTER — Ambulatory Visit: Payer: 59 | Admitting: Medical

## 2023-06-13 ENCOUNTER — Other Ambulatory Visit: Payer: Self-pay | Admitting: Orthopedic Surgery

## 2023-06-13 DIAGNOSIS — M5416 Radiculopathy, lumbar region: Secondary | ICD-10-CM

## 2023-06-28 ENCOUNTER — Encounter: Payer: Self-pay | Admitting: Orthopedic Surgery

## 2023-07-11 ENCOUNTER — Other Ambulatory Visit: Payer: Self-pay | Admitting: Medical

## 2023-07-18 ENCOUNTER — Ambulatory Visit: Payer: 59 | Admitting: Medical

## 2023-07-18 NOTE — Discharge Instructions (Signed)

## 2023-07-19 ENCOUNTER — Inpatient Hospital Stay
Admission: RE | Admit: 2023-07-19 | Discharge: 2023-07-19 | Disposition: A | Discharge status: Home / Self Care | Payer: 59 | Source: Ambulatory Visit | Attending: Orthopedic Surgery | Admitting: Orthopedic Surgery

## 2023-08-18 ENCOUNTER — Other Ambulatory Visit: Payer: Self-pay | Admitting: Medical

## 2023-08-23 ENCOUNTER — Encounter: Payer: 59 | Admitting: Medical

## 2023-10-06 ENCOUNTER — Other Ambulatory Visit: Payer: Self-pay | Admitting: Medical

## 2023-11-20 ENCOUNTER — Encounter: Payer: Self-pay | Admitting: Medical

## 2023-11-20 ENCOUNTER — Ambulatory Visit: Payer: 59 | Admitting: Medical

## 2023-11-20 VITALS — BP 124/78 | HR 88 | Ht 70.0 in | Wt 198.4 lb

## 2023-11-20 DIAGNOSIS — M79672 Pain in left foot: Secondary | ICD-10-CM

## 2023-11-20 DIAGNOSIS — Z125 Encounter for screening for malignant neoplasm of prostate: Secondary | ICD-10-CM | POA: Diagnosis not present

## 2023-11-20 DIAGNOSIS — M48062 Spinal stenosis, lumbar region with neurogenic claudication: Secondary | ICD-10-CM

## 2023-11-20 DIAGNOSIS — G8929 Other chronic pain: Secondary | ICD-10-CM

## 2023-11-20 DIAGNOSIS — E785 Hyperlipidemia, unspecified: Secondary | ICD-10-CM | POA: Diagnosis not present

## 2023-11-20 DIAGNOSIS — Z0000 Encounter for general adult medical examination without abnormal findings: Secondary | ICD-10-CM

## 2023-11-20 DIAGNOSIS — R0989 Other specified symptoms and signs involving the circulatory and respiratory systems: Secondary | ICD-10-CM

## 2023-11-20 DIAGNOSIS — M5441 Lumbago with sciatica, right side: Secondary | ICD-10-CM

## 2023-11-20 DIAGNOSIS — N529 Male erectile dysfunction, unspecified: Secondary | ICD-10-CM

## 2023-11-20 DIAGNOSIS — M79671 Pain in right foot: Secondary | ICD-10-CM

## 2023-11-20 LAB — POCT URINALYSIS DIP (PROADVANTAGE DEVICE)
Bilirubin, UA: NEGATIVE
Blood, UA: NEGATIVE
Glucose, UA: NEGATIVE mg/dL
Ketones, POC UA: NEGATIVE mg/dL
Leukocytes, UA: NEGATIVE
Nitrite, UA: NEGATIVE
Protein Ur, POC: NEGATIVE mg/dL
Specific Gravity, Urine: 1.015
Urobilinogen, Ur: 0.2
pH, UA: 6 (ref 5.0–8.0)

## 2023-11-20 MED ORDER — SILDENAFIL CITRATE 100 MG PO TABS
ORAL_TABLET | ORAL | 2 refills | Status: AC
Start: 2023-11-20 — End: ?

## 2023-11-20 MED ORDER — GABAPENTIN 300 MG PO CAPS: 300.0000 mg | ORAL_CAPSULE | Freq: Two times a day (BID) | ORAL | 2 refills | Status: AC

## 2023-11-20 MED ORDER — PRAVASTATIN SODIUM 20 MG PO TABS: 20.0000 mg | ORAL_TABLET | Freq: Every day | ORAL | 3 refills | Status: AC

## 2023-11-20 MED ORDER — GUAIFENESIN ER 600 MG PO TB12: 600.0000 mg | ORAL_TABLET | Freq: Two times a day (BID) | ORAL | 1 refills | Status: DC

## 2023-11-20 NOTE — Progress Notes (Signed)
 Subjective: Chief Complaint  Patient presents with   Annual Exam    Fasting annual exam. Right foot numbness is worsening. Left foot is starting.    Here for physical  Medical team: Dr. Erick Blinks, GI Dentist Eye doctor Dr. Aletha Halim, neurology Dr. Estill Bamberg, orthopedics Marilyn Wing, Kermit Balo, PA-C here for primary care  Concerns: Here for well visit  He needs his handicap placard renewed  He is compliant with the cholesterol pill once in a while, not daily  He would like a refill on Viagra.  For the last couple weeks has had some phlegm but no other major symptoms, no cough, no fever, no chills no bodyaches.  Would like something for phlegm.   Reviewed their medical, surgical, family, social, medication, and allergy history and updated chart as appropriate.  Past Medical History:  Diagnosis Date   ED (erectile dysfunction) 2008   Hyperlipidemia    Keloid of skin    Neuromuscular disorder (HCC)    Neuropathy    Status post LASIK surgery     Past Surgical History:  Procedure Laterality Date   COLONOSCOPY  11/16/2013   tubular adenoma, Dr. Erick Blinks   EPIDURAL BLOCK INJECTION     TO BACK 3 MONTHS AGO   LASIK Bilateral 09/18/2006   POLYPECTOMY      Social History   Socioeconomic History   Marital status: Married    Spouse name: Not on file   Number of children: Not on file   Years of education: Not on file   Highest education level: Not on file  Occupational History   Occupation: maintenance    Employer: [employer]  Tobacco Use   Smoking status: Never    Passive exposure: Never   Smokeless tobacco: Never  Vaping Use   Vaping status: Never Used  Substance and Sexual Activity   Alcohol use: No    Alcohol/week: 0.0 standard drinks of alcohol    Comment: occasional on a holiday   Drug use: No   Sexual activity: Yes  Other Topics Concern   Not on file  Social History Narrative   Married, 2 children.  Works at [employer], [employer].    07/2022   Social Drivers of Health   Financial Resource Strain: Medium Risk (11/20/2023)   Overall Financial Resource Strain (CARDIA)    Difficulty of Paying Living Expenses: Somewhat hard  Food Insecurity: No Food Insecurity (11/20/2023)   Hunger Vital Sign    Worried About Running Out of Food in the Last Year: Never true    Ran Out of Food in the Last Year: Never true  Transportation Needs: No Transportation Needs (11/20/2023)   PRAPARE - Administrator, Civil Service (Medical): No    Lack of Transportation (Non-Medical): No  Physical Activity: Inactive (11/20/2023)   Exercise Vital Sign    Days of Exercise per Week: 0 days    Minutes of Exercise per Session: 0 min  Stress: Stress Concern Present (11/20/2023)   [employer] of Occupational Health - Occupational Stress Questionnaire    Feeling of Stress : Rather much  Social Connections: Unknown (11/20/2023)   Social Connection and Isolation Panel [NHANES]    Frequency of Communication with Friends and Family: Never    Frequency of Social Gatherings with Friends and Family: Never    Attends Religious Services: Never    Database administrator or Organizations: No    Attends Banker Meetings: Never    Marital Status: Not on  file  Intimate Partner Violence: Not At Risk (11/20/2023)   Humiliation, Afraid, Rape, and Kick questionnaire    Fear of Current or Ex-Partner: No    Emotionally Abused: No    Physically Abused: No    Sexually Abused: No    Family History  Problem Relation Age of Onset   Diabetes Mother    Hypertension Father    Thyroid disease Sister    Arthritis Brother    Stroke Neg Hx    Heart disease Neg Hx    Hyperlipidemia Neg Hx    Cancer Neg Hx    Colon cancer Neg Hx    Rectal cancer Neg Hx    Stomach cancer Neg Hx    Colon polyps Neg Hx      Current Outpatient Medications:    Ascorbic Acid (VITAMIN C) 1000 MG tablet, Take 1,000 mg by mouth daily., Disp: , Rfl:    guaiFENesin  (MUCINEX) 600 MG 12 hr tablet, Take 1 tablet (600 mg total) by mouth 2 (two) times daily., Disp: 20 tablet, Rfl: 1   Multiple Vitamin (MULTIVITAMIN PO), Take by mouth daily. TAKE CHEWABLE 1-2, Disp: , Rfl:    gabapentin (NEURONTIN) 300 MG capsule, Take 1 capsule (300 mg total) by mouth 2 (two) times daily., Disp: 180 capsule, Rfl: 2   pravastatin (PRAVACHOL) 20 MG tablet, Take 1 tablet (20 mg total) by mouth daily., Disp: 90 tablet, Rfl: 3   sildenafil (VIAGRA) 100 MG tablet, TAKE 1/2 TO 1 TABLET BY MOUTH ONCE DAILY AS NEEDED, Disp: 30 tablet, Rfl: 2  No Known Allergies   Review of Systems Constitutional: -fever, -chills, -sweats, -unexpected weight change, -decreased appetite, -fatigue Allergy: -sneezing, -itching, -congestion Dermatology: -changing moles, --rash, -lumps ENT: -runny nose, -ear pain, -sore throat, -hoarseness, -sinus pain, -teeth pain, - ringing in ears, -hearing loss, -nosebleeds Cardiology: -chest pain, -palpitations, -swelling, -difficulty breathing when lying flat, -waking up short of breath Respiratory: -cough, -shortness of breath, -difficulty breathing with exercise or exertion, -wheezing, -coughing up blood Gastroenterology: -abdominal pain, -nausea, -vomiting, -diarrhea, -constipation, -blood in stool, -changes in bowel movement, -difficulty swallowing or eating Hematology: -bleeding, -bruising  Musculoskeletal: +shoulder pain and weakness right, -muscle aches, -joint swelling, -back pain, -neck pain, -cramping, -changes in gait Ophthalmology: denies vision changes, eye redness, itching, discharge Urology: -burning with urination, -difficulty urinating, -blood in urine, -urinary frequency, -urgency, -incontinence Neurology: -headache, +leg weakness, -tingling, +numbness, -memory loss, -falls, -dizziness Psychology: -depressed mood, -agitation, -sleep problems Male GU: no testicular mass, pain, no lymph nodes swollen, no swelling, no rash.     Objective:  BP 124/78    Pulse 88   Ht 5\' 10"  (1.778 m)   Wt 198 lb 6.4 oz (90 kg)   SpO2 98%   BMI 28.47 kg/m   General appearance: alert, no distress, WD/WN, African American male, seated with cane Skin: skin unremarkable HEENT: normocephalic, conjunctiva/corneas normal, sclerae anicteric, PERRLA, EOMi, nares patent, no discharge or erythema, pharynx normal Oral cavity: MMM, tongue normal, teeth in good repair Neck: supple, no lymphadenopathy, no thyromegaly, no masses, normal ROM, no bruits Chest: non tender, normal shape and expansion Heart: RRR, normal S1, S2, no murmurs Lungs: CTA bilaterally, no wheezes, rhonchi, or rales Abdomen: +bs, soft, non tender, non distended, no masses, no hepatomegaly, no splenomegaly, no bruits Back: nontender,good ROM, no scoliosis Musculoskeletal: right upper lateral thigh with linear 6cm indention chronic, + right shoulder with decreased range of motion externally, nontender to palpation may seem to have some pain with abduction  that is limited to about 70 degrees of abduction, otherwise arms and legs unremarkable upper extremities non tender, no obvious deformity, normal ROM throughout, lower extremities non tender, no obvious deformity, normal ROM throughout, legs nontender with relatively normal ROM Extremities: no edema, no cyanosis, no clubbing Pulses: 2+ symmetric, upper and 1+ lower extremities, normal cap refill Neurological: Decreased strength of the right shoulder with abduction and empty can test, otherwise strength of LE normal but sensation blunted of right lower leg and foot completed to right, but still able to feel monofilament and discretion, otherwise  no obvious weakness, alert, oriented x 3, CN2-12 intact, strength normal upper extremities and lower extremities, sensation normal throughout, DTRs 2+ throughout, no cerebellar signs, gait normal Psychiatric: normal affect, behavior normal, pleasant  GU: normal male external genitalia,circumcised, nontender, no  masses, no hernia, no lymphadenopathy Rectal: deferred/declined    Assessment and Plan :   Encounter Diagnoses  Name Primary?   Routine general medical examination at a health care facility Yes   Hyperlipidemia, unspecified hyperlipidemia type    Screening for prostate cancer    Decreased pedal pulses    Chronic right-sided low back pain with right-sided sciatica    Erectile dysfunction, unspecified erectile dysfunction type    Foot pain, bilateral    Spinal stenosis of lumbar region with neurogenic claudication    Phlegm in throat     This visit was a preventative care visit, also known as wellness visit or routine physical.   Topics typically include healthy lifestyle, diet, exercise, preventative care, vaccinations, sick and well care, proper use of emergency dept and after hours care, as well as other concerns.     Recommendations: Continue to return yearly for your annual wellness and preventative care visits.  This gives [place] a chance to discuss healthy lifestyle, exercise, vaccinations, review your chart record, and perform screenings where appropriate.  I recommend you see your eye doctor yearly for routine vision care.  I recommend you see your dentist yearly for routine dental care including hygiene visits twice yearly.   Vaccination recommendations were reviewed Immunization History  Administered Date(s) Administered   Pfizer(Comirnaty)Fall Seasonal Vaccine 12 years and older 12/18/2019, 01/15/2020   Td 02/14/2017   Tdap 10/01/2013   Zoster Recombinant(Shingrix) 08/15/2022, 10/17/2022   Consider pneumococcal prevnar 20.  He declines today   Screening for cancer: Colon cancer screening: Reviewed 11/2021 colonoscopy from Dr. Rhea Belton  We discussed PSA, prostate exam, and prostate cancer screening risks/benefits.     Skin cancer screening: Check your skin regularly for new changes, growing lesions, or other lesions of concern Come in for evaluation if you have  skin lesions of concern.  Lung cancer screening: If you have a greater than 20 pack year history of tobacco use, then you may qualify for lung cancer screening with a chest CT scan.   Please call your insurance company to inquire about coverage for this test.  We currently don't have screenings for other cancers besides breast, cervical, colon, and lung cancers.  If you have a strong family history of cancer or have other cancer screening concerns, please let me know.    Bone health: Get at least 150 minutes of aerobic exercise weekly Get weight bearing exercise at least once weekly Bone density test:  A bone density test is an imaging test that uses a type of X-ray to measure the amount of calcium and other minerals in your bones. The test may be used to diagnose or screen you for  a condition that causes weak or thin bones (osteoporosis), predict your risk for a broken bone (fracture), or determine how well your osteoporosis treatment is working. The bone density test is recommended for females 65 and older, or females or males <65 if certain risk factors such as thyroid disease, long term use of steroids such as for asthma or rheumatological issues, vitamin D deficiency, estrogen deficiency, family history of osteoporosis, self or family history of fragility fracture in first degree relative.    Heart health: Get at least 150 minutes of aerobic exercise weekly Limit alcohol It is important to maintain a healthy blood pressure and healthy cholesterol numbers  Heart disease screening: Screening for heart disease includes screening for blood pressure, fasting lipids, glucose/diabetes screening, BMI height to weight ratio, reviewed of smoking status, physical activity, and diet.    Goals include blood pressure 120/80 or less, maintaining a healthy lipid/cholesterol profile, preventing diabetes or keeping diabetes numbers under good control, not smoking or using tobacco products, exercising  most days per week or at least 150 minutes per week of exercise, and eating healthy variety of fruits and vegetables, healthy oils, and avoiding unhealthy food choices like fried food, fast food, high sugar and high cholesterol foods.    Other tests may possibly include EKG test, CT coronary calcium score, echocardiogram, exercise treadmill stress test.     Medical care options: I recommend you continue to seek care here first for routine care.  We try really hard to have available appointments Monday through Friday daytime hours for sick visits, acute visits, and physicals.  Urgent care should be used for after hours and weekends for significant issues that cannot wait till the next day.  The emergency department should be used for significant potentially life-threatening emergencies.  The emergency department is expensive, can often have long wait times for less significant concerns, so try to utilize primary care, urgent care, or telemedicine when possible to avoid unnecessary trips to the emergency department.  Virtual visits and telemedicine have been introduced since the pandemic started in 2020, and can be convenient ways to receive medical care.  We offer virtual appointments as well to assist you in a variety of options to seek medical care.    Separate significant issues discussed:  chronic back pain, spinal stenosis, leg neuropathy - uses Gabapentin prn, handicap placard renewed  hyperlipidemia -still noncompliant, updated labs today  ED - doing fine on viagra, refill today  Phlegm in throat -work on good water intake, can use Mucinex over-the-counter from time to time as needed.   Encounter Diagnoses  Name Primary?   Routine general medical examination at a health care facility Yes   Hyperlipidemia, unspecified hyperlipidemia type    Screening for prostate cancer    Decreased pedal pulses    Chronic right-sided low back pain with right-sided sciatica    Erectile dysfunction,  unspecified erectile dysfunction type    Foot pain, bilateral    Spinal stenosis of lumbar region with neurogenic claudication    Phlegm in throat      Juan Fleming was seen today for annual exam.  Diagnoses and all orders for this visit:  Routine general medical examination at a health care facility -     Cancel: CBC -     Comprehensive metabolic panel -     Lipid panel -     PSA -     POCT Urinalysis DIP (Proadvantage Device) -     CBC with Differential/Platelet  Hyperlipidemia, unspecified  hyperlipidemia type -     Lipid panel  Screening for prostate cancer -     PSA  Decreased pedal pulses -     VAS [place] ABI WITH/WO TBI; Future  Chronic right-sided low back pain with right-sided sciatica  Erectile dysfunction, unspecified erectile dysfunction type  Foot pain, bilateral  Spinal stenosis of lumbar region with neurogenic claudication  Phlegm in throat  Other orders -     guaiFENesin (MUCINEX) 600 MG 12 hr tablet; Take 1 tablet (600 mg total) by mouth 2 (two) times daily. -     pravastatin (PRAVACHOL) 20 MG tablet; Take 1 tablet (20 mg total) by mouth daily. -     gabapentin (NEURONTIN) 300 MG capsule; Take 1 capsule (300 mg total) by mouth 2 (two) times daily. -     sildenafil (VIAGRA) 100 MG tablet; TAKE 1/2 TO 1 TABLET BY MOUTH ONCE DAILY AS NEEDED   Follow-up pending labs, yearly for physical

## 2023-11-21 LAB — CBC WITH DIFFERENTIAL/PLATELET
Basophils Absolute: 0 10*3/uL (ref 0.0–0.2)
Basos: 1 %
EOS (ABSOLUTE): 0.1 10*3/uL (ref 0.0–0.4)
Eos: 2 %
Hematocrit: 49.7 % (ref 37.5–51.0)
Hemoglobin: 16.1 g/dL (ref 13.0–17.7)
Immature Grans (Abs): 0 10*3/uL (ref 0.0–0.1)
Immature Granulocytes: 0 %
Lymphocytes Absolute: 1.8 10*3/uL (ref 0.7–3.1)
Lymphs: 35 %
MCH: 27.4 pg (ref 26.6–33.0)
MCHC: 32.4 g/dL (ref 31.5–35.7)
MCV: 85 fL (ref 79–97)
Monocytes Absolute: 0.6 10*3/uL (ref 0.1–0.9)
Monocytes: 11 %
Neutrophils Absolute: 2.5 10*3/uL (ref 1.4–7.0)
Neutrophils: 51 %
Platelets: 347 10*3/uL (ref 150–450)
RBC: 5.87 x10E6/uL — ABNORMAL HIGH (ref 4.14–5.80)
RDW: 13.8 % (ref 11.6–15.4)
WBC: 5 10*3/uL (ref 3.4–10.8)

## 2023-11-21 LAB — SPECIMEN STATUS REPORT

## 2023-11-21 LAB — LIPID PANEL: Chol/HDL Ratio: 4.3 ratio (ref 0.0–5.0)

## 2023-11-22 LAB — COMPREHENSIVE METABOLIC PANEL
ALT: 23 IU/L (ref 0–44)
AST: 21 IU/L (ref 0–40)
Albumin: 4.6 g/dL (ref 3.9–4.9)
Alkaline Phosphatase: 88 IU/L (ref 44–121)
BUN/Creatinine Ratio: 12 (ref 10–24)
BUN: 15 mg/dL (ref 8–27)
Bilirubin Total: 0.4 mg/dL (ref 0.0–1.2)
CO2: 21 mmol/L (ref 20–29)
Calcium: 9.8 mg/dL (ref 8.6–10.2)
Chloride: 103 mmol/L (ref 96–106)
Creatinine, Ser: 1.23 mg/dL (ref 0.76–1.27)
Globulin, Total: 2.6 g/dL (ref 1.5–4.5)
Glucose: 87 mg/dL (ref 70–99)
Potassium: 4.3 mmol/L (ref 3.5–5.2)
Sodium: 138 mmol/L (ref 134–144)
Total Protein: 7.2 g/dL (ref 6.0–8.5)
eGFR: 66 mL/min/{1.73_m2} (ref >59)

## 2023-11-22 LAB — CBC WITH DIFFERENTIAL/PLATELET
Basophils Absolute: 0 10*3/uL (ref 0.0–0.2)
Basos: 1 %
EOS (ABSOLUTE): 0.1 10*3/uL (ref 0.0–0.4)
Eos: 2 %
Hematocrit: 49.5 % (ref 37.5–51.0)
Hemoglobin: 16.1 g/dL (ref 13.0–17.7)
Immature Grans (Abs): 0 10*3/uL (ref 0.0–0.1)
Immature Granulocytes: 0 %
Lymphocytes Absolute: 1.8 10*3/uL (ref 0.7–3.1)
Lymphs: 35 %
MCH: 27.5 pg (ref 26.6–33.0)
MCHC: 32.5 g/dL (ref 31.5–35.7)
MCV: 85 fL (ref 79–97)
Monocytes Absolute: 0.5 10*3/uL (ref 0.1–0.9)
Monocytes: 11 %
Neutrophils Absolute: 2.6 10*3/uL (ref 1.4–7.0)
Neutrophils: 51 %
Platelets: 352 10*3/uL (ref 150–450)
RBC: 5.85 x10E6/uL — ABNORMAL HIGH (ref 4.14–5.80)
RDW: 13.7 % (ref 11.6–15.4)
WBC: 5 10*3/uL (ref 3.4–10.8)

## 2023-11-22 LAB — SPECIMEN STATUS REPORT

## 2023-11-22 LAB — LIPID PANEL
Cholesterol, Total: 234 mg/dL — ABNORMAL HIGH (ref 100–199)
HDL: 54 mg/dL (ref >39)
LDL CALC COMMENT:: 4.3 ratio (ref 0.0–5.0)
LDL Chol Calc (NIH): 152 mg/dL — ABNORMAL HIGH (ref 0–99)
Triglycerides: 153 mg/dL — ABNORMAL HIGH (ref 0–149)
VLDL Cholesterol Cal: 28 mg/dL (ref 5–40)

## 2023-11-22 LAB — PSA: Prostate Specific Ag, Serum: 2.6 ng/mL (ref 0.0–4.0)

## 2023-11-22 NOTE — Progress Notes (Signed)
 Results sent through MyChart

## 2023-11-23 ENCOUNTER — Ambulatory Visit (HOSPITAL_COMMUNITY)
Admission: RE | Admit: 2023-11-23 | Discharge: 2023-11-23 | Disposition: A | Discharge status: Home / Self Care | Source: Ambulatory Visit | Attending: Medical | Admitting: Medical

## 2023-11-23 DIAGNOSIS — R0989 Other specified symptoms and signs involving the circulatory and respiratory systems: Secondary | ICD-10-CM | POA: Diagnosis present

## 2023-11-23 LAB — VAS US ABI WITH/WO TBI
Left ABI: 1.9
Right ABI: 1.29

## 2023-11-23 NOTE — Progress Notes (Signed)
 Thankfully your ABI blood flow test was normal

## 2024-06-17 ENCOUNTER — Ambulatory Visit: Admitting: Medical

## 2024-06-17 VITALS — BP 110/80 | HR 76 | Temp 97.9°F | Wt 206.4 lb

## 2024-06-17 DIAGNOSIS — Q159 Congenital malformation of eye, unspecified: Secondary | ICD-10-CM

## 2024-06-17 DIAGNOSIS — J309 Allergic rhinitis, unspecified: Secondary | ICD-10-CM

## 2024-06-17 DIAGNOSIS — J029 Acute pharyngitis, unspecified: Secondary | ICD-10-CM | POA: Diagnosis not present

## 2024-06-17 DIAGNOSIS — H9193 Unspecified hearing loss, bilateral: Secondary | ICD-10-CM | POA: Diagnosis not present

## 2024-06-17 DIAGNOSIS — H938X3 Other specified disorders of ear, bilateral: Secondary | ICD-10-CM

## 2024-06-17 MED ORDER — PSEUDOEPHEDRINE HCL 60 MG PO TABS: 60.0000 mg | ORAL_TABLET | Freq: Two times a day (BID) | ORAL | 0 refills | Status: AC

## 2024-06-17 MED ORDER — AMOXICILLIN 875 MG PO TABS: 875.0000 mg | ORAL_TABLET | Freq: Two times a day (BID) | ORAL | 0 refills | Status: AC

## 2024-06-17 NOTE — Patient Instructions (Signed)
 Right eye growth Growth under eyelid and in eye corner. Differential includes blocked tear duct or ocular abnormalities. - Follow up with ophthalmologist for evaluation and potential removal.  Sinus pressure, history of allergic rhinitis - Continue Allegra  daily for the next month or year-round if you use this year-round -Continue your nasal sprays such as Flonase -Do not use Afrin daily if you are using Afrin which is a short-term sinus decongestant spray -Begin Sudafed twice daily for the next 3 to 5 days to help with congestion. -If not improving over the next 5 days and add amoxicillin  antibiotic -Both medicines sent to the pharmacy  Sore throat -You can use salt water gargles or warm fluids to soothe the pain -You can use Tylenol  or ibuprofen  short-term to help with pain   Your hearing screen was abnormal particularly at the higher frequencies -If you do not feel your hearing has returned to normal after course of treatment above within the next 2 to 4 weeks then consider formal evaluation with audiology

## 2024-06-17 NOTE — Progress Notes (Signed)
 Subjective:  Juan Fleming is a 63 y.o. male who presents for Chief Complaint  Patient presents with   Acute Visit    Allergies with sore throat, right eye swollen. Sinus pressure, feels like his ears are in a tunnel, coughing with a lot of mucous at night.  Been going on 1.53months.      History of Present Illness Juan Fleming is a 63 year old male who presents with a growth on his right eye and sinus issues.  He has had a growth on his right eye for about a month and a half, which appeared after he scratched his eye. He was given a treatment for the eye growth but it hasn't helped.   He was referred to ophthalmology in [place] and sees them 06/27/24.    He has been experiencing worsening sinus issues, including nasal congestion, ear pain, and pressure in his cheeks and forehead. His ears feel stuffy, and he has difficulty hearing. He also has a persistent sore throat and difficulty sleeping due to these symptoms. No runny nose or yellow mucus is present. He is currently taking fexofenadine  (Allegra ) and using an allergy nasal spray as needed when symptoms worsen.  He recently expelled a tonsil stone, which was painful and feels incompletely removed.  His family history includes cataracts in his mother and brother. He lives on [address] and works in the area.  No other aggravating or relieving factors. No other complaint.  Past Medical History:  Diagnosis Date   ED (erectile dysfunction) 2008   Hyperlipidemia    Keloid of skin    Neuromuscular disorder (HCC)    Neuropathy    Status post LASIK surgery    Current Outpatient Medications on File Prior to Visit  Medication Sig Dispense Refill   Ascorbic Acid (VITAMIN C) 1000 MG tablet Take 1,000 mg by mouth daily.     gabapentin  (NEURONTIN ) 300 MG capsule Take 1 capsule (300 mg total) by mouth 2 (two) times daily. 180 capsule 2   guaiFENesin  (MUCINEX ) 600 MG 12 hr tablet Take 1 tablet (600 mg total) by mouth 2 (two) times daily.  20 tablet 1   Multiple Vitamin (MULTIVITAMIN PO) Take by mouth daily. TAKE CHEWABLE 1-2     pravastatin  (PRAVACHOL ) 20 MG tablet Take 1 tablet (20 mg total) by mouth daily. 90 tablet 3   sildenafil  (VIAGRA ) 100 MG tablet TAKE 1/2 TO 1 TABLET BY MOUTH ONCE DAILY AS NEEDED 30 tablet 2   No current facility-administered medications on file prior to visit.    The following portions of the patient's history were reviewed and updated as appropriate: allergies, current medications, past family history, past medical history, past social history, past surgical history and problem list.  ROS Otherwise as in subjective above  Objective: BP 110/80   Pulse 76   Temp 97.9 F (36.6 C)   Wt 206 lb 6.4 oz (93.6 kg)   BMI 29.62 kg/m   General appearance: alert, no distress, well developed, well nourished HEENT: normocephalic, sclerae anicteric, conjunctiva pink and moist, corner of right eye medially is a growth that is pinkish arising from the conjunctiva approximately 3 to 4 mm diameter somewhat roundish, there is another slightly raised 3 mm similar-appearing growth just inside the conjunctiva of the right medial inferior eyelid, TMs flat with air-fluid levels,, nares patent, clear to mucoid discharge discharge, mild erythema, pharynx normal Oral cavity: MMM, no lesions Neck: supple, no lymphadenopathy, no thyromegaly, no masses   Assessment: Encounter Diagnoses  Name Primary?   Allergic rhinitis, unspecified seasonality, unspecified trigger Yes   Ear pressure, bilateral    Decreased hearing of both ears    Sore throat    Eye abnormality      Plan: Right eye growth Growth under eyelid and in eye corner. Differential includes blocked tear duct or ocular abnormalities. - Follow up with ophthalmologist for evaluation and potential removal.  Sinus pressure, history of allergic rhinitis - Continue Allegra  daily for the next month or year-round if you use this year-round -Continue your nasal  sprays such as Flonase -Do not use Afrin daily if you are using Afrin which is a short-term sinus decongestant spray -Begin Sudafed twice daily for the next 3 to 5 days to help with congestion. -If not improving over the next 5 days and add amoxicillin  antibiotic -Both medicines sent to the pharmacy  Sore throat -You can use salt water gargles or warm fluids to soothe the pain -You can use Tylenol  or ibuprofen  short-term to help with pain   Your hearing screen was abnormal particularly at the higher frequencies -If you do not feel your hearing has returned to normal after course of treatment above within the next 2 to 4 weeks then consider formal evaluation with audiology    Juan Fleming was seen today for acute visit.  Diagnoses and all orders for this visit:  Allergic rhinitis, unspecified seasonality, unspecified trigger  Ear pressure, bilateral  Decreased hearing of both ears  Sore throat  Eye abnormality  Other orders -     amoxicillin  (AMOXIL ) 875 MG tablet; Take 1 tablet (875 mg total) by mouth 2 (two) times daily for 10 days. -     pseudoephedrine (SUDAFED) 60 MG tablet; Take 1 tablet (60 mg total) by mouth in the morning and at bedtime.    Follow up: with ophthalmology

## 2024-07-04 ENCOUNTER — Other Ambulatory Visit (HOSPITAL_COMMUNITY): Payer: Self-pay

## 2024-07-04 MED ORDER — WEGOVY 0.25 MG/0.5ML ~~LOC~~ SOAJ
0.2500 mg | SUBCUTANEOUS | 0 refills | Status: AC
  Filled 2024-07-04: qty 2, 28d supply, fill #0

## 2024-07-07 ENCOUNTER — Other Ambulatory Visit: Payer: Self-pay

## 2024-07-07 ENCOUNTER — Other Ambulatory Visit (HOSPITAL_COMMUNITY): Payer: Self-pay

## 2024-07-07 ENCOUNTER — Encounter: Payer: Self-pay | Admitting: Pharmacist

## 2024-07-08 ENCOUNTER — Other Ambulatory Visit: Payer: Self-pay

## 2024-08-08 ENCOUNTER — Other Ambulatory Visit (HOSPITAL_COMMUNITY): Payer: Self-pay

## 2024-08-08 ENCOUNTER — Other Ambulatory Visit: Payer: Self-pay

## 2024-08-08 MED ORDER — WEGOVY 0.5 MG/0.5ML ~~LOC~~ SOAJ
SUBCUTANEOUS | 1 refills | Status: DC
  Filled 2024-08-08: qty 2, 28d supply, fill #0

## 2024-09-05 ENCOUNTER — Other Ambulatory Visit (HOSPITAL_COMMUNITY): Payer: Self-pay

## 2024-09-05 ENCOUNTER — Other Ambulatory Visit: Payer: Self-pay

## 2024-09-05 ENCOUNTER — Other Ambulatory Visit (HOSPITAL_BASED_OUTPATIENT_CLINIC_OR_DEPARTMENT_OTHER): Payer: Self-pay

## 2024-09-05 MED ORDER — WEGOVY 1 MG/0.5ML ~~LOC~~ SOAJ
1.0000 mg | SUBCUTANEOUS | 0 refills | Status: DC
  Filled 2024-09-05 (×2): qty 2, 28d supply, fill #0

## 2024-09-05 MED ORDER — WEGOVY 1.7 MG/0.75ML ~~LOC~~ SOAJ
1.7000 mg | SUBCUTANEOUS | 0 refills | Status: DC
  Filled 2024-09-05 – 2024-10-23 (×2): qty 3, 28d supply, fill #0

## 2024-09-08 ENCOUNTER — Other Ambulatory Visit: Payer: Self-pay

## 2024-09-12 ENCOUNTER — Other Ambulatory Visit: Payer: Self-pay

## 2024-09-15 ENCOUNTER — Telehealth: Payer: Self-pay | Admitting: Medical

## 2024-09-15 NOTE — Telephone Encounter (Signed)
 Patient dropped off a employee medical form to be filled out, was sent back In [facility].

## 2024-09-16 ENCOUNTER — Ambulatory Visit
Admission: EM | Admit: 2024-09-16 | Discharge: 2024-09-16 | Disposition: A | Discharge status: Home / Self Care | Attending: Emergency Medicine | Admitting: Emergency Medicine

## 2024-09-16 ENCOUNTER — Encounter: Payer: Self-pay | Admitting: Emergency Medicine

## 2024-09-16 DIAGNOSIS — J209 Acute bronchitis, unspecified: Secondary | ICD-10-CM | POA: Diagnosis not present

## 2024-09-16 DIAGNOSIS — J069 Acute upper respiratory infection, unspecified: Secondary | ICD-10-CM | POA: Diagnosis not present

## 2024-09-16 MED ORDER — GUAIFENESIN 400 MG PO TABS: ORAL_TABLET | ORAL | 0 refills | Status: AC

## 2024-09-16 MED ORDER — METHYLPREDNISOLONE SODIUM SUCC 125 MG IJ SOLR
80.0000 mg | Freq: Once | INTRAMUSCULAR | Status: AC
  Administered 2024-09-16: 80 mg via INTRAMUSCULAR

## 2024-09-16 MED ORDER — PROMETHAZINE-DM 6.25-15 MG/5ML PO SYRP: 5.0000 mL | ORAL_SOLUTION | Freq: Every evening | ORAL | 0 refills | Status: AC | PRN

## 2024-09-16 MED ORDER — LEVALBUTEROL TARTRATE 45 MCG/ACT IN AERO: 2.0000 | INHALATION_SPRAY | Freq: Four times a day (QID) | RESPIRATORY_TRACT | 2 refills | Status: AC

## 2024-09-16 MED ORDER — IPRATROPIUM-ALBUTEROL 0.5-2.5 (3) MG/3ML IN SOLN
3.0000 mL | Freq: Once | RESPIRATORY_TRACT | Status: AC
  Administered 2024-09-16: 3 mL via RESPIRATORY_TRACT

## 2024-09-16 MED ORDER — AEROCHAMBER PLUS FLO-VU MEDIUM MISC
1.0000 | Freq: Once | Status: AC
  Administered 2024-09-16: 1

## 2024-09-16 NOTE — ED Triage Notes (Signed)
 Pt reports a productive cough x 10 days. Reports going to a different urgent care on 12/22 and was prescribed Augmentin . Reports the cough has not improved .

## 2024-09-16 NOTE — Telephone Encounter (Signed)
 Pt will come by and pick form up. Placed in brown folder up front

## 2024-09-16 NOTE — Discharge Instructions (Addendum)
 Please read below to learn more about the medications, dosages and frequencies that I recommend to help alleviate your symptoms and to get you feeling better soon:   Solu-Medrol  IM (methylprednisolone ):  To quickly address your significant respiratory inflammation, you were provided with an injection of Solu-Medrol  in the office today.  You should continue to feel the full benefit of the steroid for the next 4 to 6 hours.                 ProAir , Ventolin , Proventil  (albuterol ): This inhaled medication contains a short acting beta agonist bronchodilator.  This medication relaxes the smooth muscle of the airway in the lungs.  When these muscles are tight, breathing becomes more constricted.  The result of relaxation of the smooth muscle is increased air movement and improved work of breathing.  This is a short acting medication that can be used every 4-6 hours as needed for increased work of breathing, shortness of breath, wheezing and excessive coughing.  It comes in the form of a handheld inhaler or nebulizer solution.  I recommended that for the next 3 to 4 days, this medication is used 4 times daily on a scheduled basis then decrease to twice daily and as needed until symptoms have completely resolved which I anticipate will be several weeks.  Robitussin, Mucinex  (guaifenesin ): This is a daytime expectorant.  This single symptom reliever helps break up chest congestion and loosen up thick nasal drainage making phlegm and drainage easier to cough up and to blow out from your nose.  I recommend taking 400 mg in either liquid or tablet form three times daily as needed.  I do not recommend the 12-hour extended relief version or doses higher than 400 mg per each dose as these often make some patients feel jittery or jumpy and can interfere with sleep.  I also do not recommend that you purchase guaifenesin  with the ingredient  DM which is dextromethorphan, a cough suppressant which I only recommend taking at  bedtime.  Guaifenesin  400 mg is a safe dose for people who are being treated for high blood pressure.     Promethazine DM: Promethazine is both a nasal decongestant that dries up mucous membranes and an antinausea medication.  Promethazine often makes most patients feel fairly sleepy.  DM is dextromethorphan, a single symptom reliever which is a cough suppressant found in many over-the-counter cough medications and combination cold preparations.  Please take 5 mL before bedtime to minimize your cough which will help you sleep better.  I have sent a prescription for this medication to your pharmacy because it cannot be purchased over-the-counter.   If symptoms have not meaningfully improved in the next 5 to 7 days, please return for repeat evaluation or follow-up with your regular provider.  If symptoms have worsened in the next 3 to 5 days, please go to the emergency room for further evaluation.    Thank you for visiting urgent care today.  We appreciate the opportunity to participate in your care.

## 2024-09-16 NOTE — ED Provider Notes (Signed)
 Juan Fleming    CSN: [id number] Arrival date & time: 09/16/24  0808    HISTORY   Chief Complaint  Patient presents with   Cough   HPI Juan Fleming is a pleasant, 63 y.o. male who presents to urgent care today. Patient complains of cough productive of sputum for the past 10 days.  Patient states his cough is been getting progressively worse.  Patient states sometimes he coughs so hard he feels like he is going to vomit, denies making a whooping sound.  Patient states he went to an urgent care 8 days ago and was prescribed Augmentin .  States he thought the Augmentin  was prescribed for his cough and states that it did not help.  EMR reviewed by me, patient was seen at [facility], the Augmentin  was actually prescribed for presumed bacterial sinus infection.  Patient was not provided with any medications for his cough.  Patient states he has never smoked cigarettes.  Patient states he used to have asthma when he was younger but he grew out of it.  Patient denies history of allergies.  Patient denies fever, body aches, chills, chest pain, chest tightness, nausea, vomiting, diarrhea.  States sometimes the cough, which seems to be worse at night, makes him feel very short of breath but denies shortness of breath otherwise.  Patient has a slightly elevated blood pressure on arrival with otherwise normal vital signs.  The history is provided by the patient.  Cough  Past Medical History:  Diagnosis Date   ED (erectile dysfunction) 2008   Hyperlipidemia    Keloid of skin    Neuromuscular disorder (HCC)    Neuropathy    Status post LASIK surgery    Patient Active Problem List   Diagnosis Date Noted   Spinal stenosis of lumbar region with neurogenic claudication 06/05/2023   Bulging lumbar disc 06/05/2023   Numbness and tingling of foot 06/05/2023   Facet arthropathy 06/05/2023   Chronic right-sided low back pain with right-sided sciatica 06/05/2023   Flat foot 03/02/2020    Screening for diabetes mellitus 03/02/2020   Foot pain, bilateral 03/02/2020   Plantar fascia syndrome 03/02/2020   Tubular adenoma 10/16/2018   Influenza vaccination declined 10/16/2018   Chronic pain of right knee 10/16/2018   Routine general medical examination at a health care facility 09/12/2017   S/P LASIK surgery 09/12/2017   Screening for prostate cancer 09/12/2017   Witnessed apneic spells 09/12/2017   Low testosterone  05/23/2016   Hypogonadism in male 05/23/2016   Erectile dysfunction 08/03/2011   Hyperlipidemia 08/03/2011   Past Surgical History:  Procedure Laterality Date   COLONOSCOPY  11/16/2013   tubular adenoma, Dr. Gordy Starch   EPIDURAL BLOCK INJECTION     TO BACK 3 MONTHS AGO   LASIK Bilateral 09/18/2006   POLYPECTOMY      Home Medications    Prior to Admission medications  Medication Sig Start Date End Date Taking? Authorizing Provider  Ascorbic Acid (VITAMIN C) 1000 MG tablet Take 1,000 mg by mouth daily.    [provider]  gabapentin  (NEURONTIN ) 300 MG capsule Take 1 capsule (300 mg total) by mouth 2 (two) times daily. 11/20/23   Tysinger, Alm RAMAN, PA-C  guaiFENesin  (MUCINEX ) 600 MG 12 hr tablet Take 1 tablet (600 mg total) by mouth 2 (two) times daily. 11/20/23   Tysinger, Alm RAMAN, PA-C  Multiple Vitamin (MULTIVITAMIN PO) Take by mouth daily. TAKE CHEWABLE 1-2    [provider]  pravastatin  (PRAVACHOL ) 20 MG tablet Take 1 tablet (20 mg total) by mouth daily. 11/20/23   Tysinger, Alm RAMAN, PA-C  pseudoephedrine  (SUDAFED) 60 MG tablet Take 1 tablet (60 mg total) by mouth in the morning and at bedtime. 06/17/24   Tysinger, Alm RAMAN, PA-C  sildenafil  (VIAGRA ) 100 MG tablet TAKE 1/2 TO 1 TABLET BY MOUTH ONCE DAILY AS NEEDED 11/20/23   Tysinger, Alm RAMAN, PA-C  WEGOVY  0.25 MG/0.5ML SOAJ SQ injection Inject 0.25 mg into the skin once a week at 9 am. 07/04/24     WEGOVY  1 MG/0.5ML SOAJ SQ injection Inject 1 mg into the skin once a week. 09/05/24      WEGOVY  1.7 MG/0.75ML SOAJ SQ injection Inject 1.7 mg into the skin once a week. 09/05/24       Family History Family History  Problem Relation Age of Onset   Diabetes Mother    Hypertension Father    Thyroid disease Sister    Arthritis Brother    Stroke Neg Hx    Heart disease Neg Hx    Hyperlipidemia Neg Hx    Cancer Neg Hx    Colon cancer Neg Hx    Rectal cancer Neg Hx    Stomach cancer Neg Hx    Colon polyps Neg Hx    Social History Social History[1] Allergies   Patient has no known allergies.  Review of Systems Review of Systems  Respiratory:  Positive for cough.    Pertinent findings revealed after performing a 14 point review of systems has been noted in the history of present illness.  Physical Exam Vital Signs BP (!) 132/92 (BP Location: Right Arm)   Pulse 100   Temp 98.3 F (36.8 C) (Oral)   Resp 18   SpO2 98%   No data found.  Physical Exam Vitals and nursing note reviewed.  Constitutional:      General: He is awake. He is not in acute distress.    Appearance: Normal appearance. He is well-developed and well-groomed. He is not ill-appearing.  HENT:     Head: Normocephalic and atraumatic.     Salivary Glands: Right salivary gland is not diffusely enlarged or tender. Left salivary gland is not diffusely enlarged or tender.     Right Ear: Hearing, ear canal and external ear normal.     Left Ear: Hearing, ear canal and external ear normal.     Ears:     Comments: Bilateral EACs bulging with clear fluid    Nose: Mucosal edema and congestion present. No rhinorrhea.     Right Turbinates: Not enlarged, swollen or pale.     Left Turbinates: Not enlarged, swollen or pale.     Right Sinus: No maxillary sinus tenderness or frontal sinus tenderness.     Left Sinus: No maxillary sinus tenderness or frontal sinus tenderness.     Mouth/Throat:     Lips: Pink. No lesions.     Mouth: Mucous membranes are moist. No oral lesions.     Tongue: No lesions. Tongue  does not deviate from midline.     Palate: No mass and lesions.     Pharynx: Oropharynx is clear. Uvula midline. No pharyngeal swelling, oropharyngeal exudate, posterior oropharyngeal erythema, uvula swelling or postnasal drip.     Tonsils: No tonsillar exudate. 0 on the right. 0 on the left.  Eyes:     General: Lids are normal.        Right eye: No discharge.  Left eye: No discharge.     Conjunctiva/sclera: Conjunctivae normal.     Right eye: Right conjunctiva is not injected.     Left eye: Left conjunctiva is not injected.  Neck:     Trachea: Trachea and phonation normal.  Cardiovascular:     Rate and Rhythm: Normal rate and regular rhythm.     Heart sounds: Normal heart sounds.  Pulmonary:     Effort: Pulmonary effort is normal.     Breath sounds: No stridor. Examination of the right-upper field reveals wheezing. Examination of the left-upper field reveals wheezing. Examination of the right-middle field reveals wheezing. Examination of the left-middle field reveals wheezing. Decreased breath sounds and wheezing present.     Comments: Severe bronchospasm with cough after each exhalation.  Repeat auscultation performed post nebulized bronchodilator demonstrated resolution of cough and meaningful improvement of wheezing.  Patient reported improved work of breathing as well. Chest:     Chest wall: No tenderness.  Musculoskeletal:        General: Normal range of motion.     Cervical back: Full passive range of motion without pain, normal range of motion and neck supple. Normal range of motion.  Lymphadenopathy:     Cervical: No cervical adenopathy.  Skin:    General: Skin is warm and dry.     Findings: No erythema or rash.  Neurological:     General: No focal deficit present.     Mental Status: He is alert and oriented to person, place, and time. Mental status is at baseline.  Psychiatric:        Attention and Perception: Attention and perception normal.        Mood and Affect:  Mood and affect normal.        Speech: Speech normal.        Behavior: Behavior normal. Behavior is cooperative.        Thought Content: Thought content normal.     Visual Acuity Right Eye Distance:   Left Eye Distance:   Bilateral Distance:    Right Eye Near:   Left Eye Near:    Bilateral Near:     Fleming Couse / Diagnostics / Procedures:     Radiology No results found.  Procedures Procedures (including critical care time) EKG  Pending results:  Labs Reviewed - No data to display  Medications Ordered in Fleming: Medications  ipratropium-albuterol  (DUONEB) 0.5-2.5 (3) MG/3ML nebulizer solution 3 mL (3 mLs Nebulization Given 09/16/24 0852)  methylPREDNISolone  sodium succinate (SOLU-MEDROL ) 125 mg/2 mL injection 80 mg (80 mg Intramuscular Given 09/16/24 0923)  AeroChamber Plus Flo-Vu Medium MISC 1 each (1 each Other Given 09/16/24 0923)    Fleming Diagnoses / Final Clinical Impressions(s)   I have reviewed the triage vital signs and the nursing notes.  Pertinent labs & imaging results that were available during my care of the patient were reviewed by me and considered in my medical decision making (see chart for details).    Final diagnoses:  Viral URI with cough  Bronchitis, acute, with bronchospasm   Patient was provided with an injection of Solu-Medrol  during his visit today for relief of lower respiratory tract inflammation.  Patient encouraged to begin using lev albuterol  2 puffs 4 times daily.  Guaifenesin  recommended to promote expectoration and Promethazine DM provided for nighttime cough that patient gets to sleep.  Patient encouraged to finish Augmentin .  Conservative care recommended.  Return precautions advised.  Please see discharge instructions below for details of plan of  care as provided to patient. ED Prescriptions     Medication Sig Dispense Auth. Provider   levalbuterol (XOPENEX HFA) 45 MCG/ACT inhaler Inhale 2 puffs into the lungs 4 (four) times daily. 1 each  Joesph Shaver Scales, PA-C   promethazine-dextromethorphan (PROMETHAZINE-DM) 6.25-15 MG/5ML syrup Take 5 mLs by mouth at bedtime as needed for cough. 60 mL Joesph Shaver Scales, PA-C   guaifenesin  (HUMIBID E) 400 MG TABS tablet Take 1 tablet 3 times daily as needed for chest congestion and cough 30 tablet Joesph Shaver Scales, PA-C      PDMP not reviewed this encounter.    Discharge Instructions      Please read below to learn more about the medications, dosages and frequencies that I recommend to help alleviate your symptoms and to get you feeling better soon:   Solu-Medrol  IM (methylprednisolone ):  To quickly address your significant respiratory inflammation, you were provided with an injection of Solu-Medrol  in the office today.  You should continue to feel the full benefit of the steroid for the next 4 to 6 hours.                 ProAir , Ventolin , Proventil  (albuterol ): This inhaled medication contains a short acting beta agonist bronchodilator.  This medication relaxes the smooth muscle of the airway in the lungs.  When these muscles are tight, breathing becomes more constricted.  The result of relaxation of the smooth muscle is increased air movement and improved work of breathing.  This is a short acting medication that can be used every 4-6 hours as needed for increased work of breathing, shortness of breath, wheezing and excessive coughing.  It comes in the form of a handheld inhaler or nebulizer solution.  I recommended that for the next 3 to 4 days, this medication is used 4 times daily on a scheduled basis then decrease to twice daily and as needed until symptoms have completely resolved which I anticipate will be several weeks.  Robitussin, Mucinex  (guaifenesin ): This is a daytime expectorant.  This single symptom reliever helps break up chest congestion and loosen up thick nasal drainage making phlegm and drainage easier to cough up and to blow out from your nose.  I recommend  taking 400 mg in either liquid or tablet form three times daily as needed.  I do not recommend the 12-hour extended relief version or doses higher than 400 mg per each dose as these often make some patients feel jittery or jumpy and can interfere with sleep.  I also do not recommend that you purchase guaifenesin  with the ingredient  DM which is dextromethorphan, a cough suppressant which I only recommend taking at bedtime.  Guaifenesin  400 mg is a safe dose for people who are being treated for high blood pressure.     Promethazine DM: Promethazine is both a nasal decongestant that dries up mucous membranes and an antinausea medication.  Promethazine often makes most patients feel fairly sleepy.  DM is dextromethorphan, a single symptom reliever which is a cough suppressant found in many over-the-counter cough medications and combination cold preparations.  Please take 5 mL before bedtime to minimize your cough which will help you sleep better.  I have sent a prescription for this medication to your pharmacy because it cannot be purchased over-the-counter.   If symptoms have not meaningfully improved in the next 5 to 7 days, please return for repeat evaluation or follow-up with your regular provider.  If symptoms have worsened in the next 3 to  5 days, please go to the emergency room for further evaluation.    Thank you for visiting urgent care today.  We appreciate the opportunity to participate in your care.       Disposition Upon Discharge:  Condition: stable for discharge home  Patient presented with an acute illness with associated systemic symptoms and significant discomfort requiring urgent management. In my opinion, this is a condition that a prudent lay person (someone who possesses an average knowledge of health and medicine) may potentially expect to result in complications if not addressed urgently such as respiratory distress, impairment of bodily function or dysfunction of bodily  organs.   Routine symptom specific, illness specific and/or disease specific instructions were discussed with the patient and/or caregiver at length.   As such, the patient has been evaluated and assessed, work-up was performed and treatment was provided in alignment with urgent care protocols and evidence based medicine.  Patient/parent/caregiver has been advised that the patient may require follow up for further testing and treatment if the symptoms continue in spite of treatment, as clinically indicated and appropriate.  Patient/parent/caregiver has been advised to return to the [facility] or PCP if no better; to PCP or the Emergency Department if new signs and symptoms develop, or if the current signs or symptoms continue to change or worsen for further workup, evaluation and treatment as clinically indicated and appropriate  The patient will follow up with their current PCP if and as advised. If the patient does not currently have a PCP we will assist them in obtaining one.   The patient may need specialty follow up if the symptoms continue, in spite of conservative treatment and management, for further workup, evaluation, consultation and treatment as clinically indicated and appropriate.  Patient/parent/caregiver verbalized understanding and agreement of plan as discussed.  All questions were addressed during visit.  Please see discharge instructions below for further details of plan.  This office note has been dictated using Teaching laboratory technician.  Unfortunately, this method of dictation can sometimes lead to typographical or grammatical errors.  I apologize for your inconvenience in advance if this occurs.  Please do not hesitate to reach out to me if clarification is needed.       [1]  Social History Tobacco Use   Smoking status: Never    Passive exposure: Never   Smokeless tobacco: Never  Vaping Use   Vaping status: Never Used  Substance Use Topics   Alcohol use: No     Alcohol/week: 0.0 standard drinks of alcohol    Comment: occasional on a holiday   Drug use: No     Joesph Shaver Scales, PA-C 09/16/24 1056  "

## 2024-09-16 NOTE — Telephone Encounter (Signed)
 Sent through email   Copied from CRM [id number]. Topic: General - Other >> Sep 16, 2024  4:47 PM Juan Fleming wrote: Reason for CRM: patient asking that the forms be emailed to him at [email or web address]

## 2024-09-16 NOTE — Telephone Encounter (Signed)
 Copied from CRM [id number]. Topic: General - Other >> Sep 16, 2024  4:07 PM Leah C wrote: Reason for CRM:  Patient called to check if employee medical form to be filled out was completed. Patient asked about closure tomorrow, advised of time and patient stated that he will call back tomorrow morning.

## 2024-09-19 ENCOUNTER — Ambulatory Visit: Payer: Self-pay

## 2024-09-19 NOTE — Telephone Encounter (Signed)
" °  FYI Only or Action Required?: FYI only for provider: Home Care.  Patient was last seen in primary care on 06/17/2024 by [name], PA-C.  Called Nurse Triage reporting Medication Question.   Triage Disposition: Information or Advice Only Call  Patient/caregiver understands and will follow disposition?: Yes  Copied from CRM [id number]. Topic: Clinical - Red Word Triage >> Sep 19, 2024 12:07 PM Juan Fleming wrote: Red Word that prompted transfer to Nurse Triage: Patient was seen on 12/30 at [facility] for bronchitis. Patient reports coughing up yellow phlegm and wheezing.Patient is asking whether they can receive a flu shot at this time Reason for Disposition  Caller has medicine question, adult has minor symptoms, caller declines triage, AND triager answers question  Answer Assessment - Initial Assessment Questions 1. NAME of MEDICINE: What medicine(s) are you calling about?     Flu Vaccine  2. QUESTION: What is your question? (e.g., double dose of medicine, side effect)     Pt seen and treated on 12.30 at [facility] for URI symptoms and wants to know if it is still okay to receive the flu vaccine  Pt agrees with plan of care, will call back for any worsening symptoms  Protocols used: Medication Question Call-A-AH  "

## 2024-10-23 ENCOUNTER — Other Ambulatory Visit (HOSPITAL_BASED_OUTPATIENT_CLINIC_OR_DEPARTMENT_OTHER): Payer: Self-pay

## 2024-11-27 ENCOUNTER — Encounter: Payer: Self-pay | Admitting: Medical
